# Patient Record
Sex: Female | Born: 1955 | Race: Black or African American | Hispanic: No | Marital: Single | Smoking: Never smoker
Health system: Southern US, Community
[De-identification: ages and names within clinical notes are randomized; demographics above are authoritative.]

## PROBLEM LIST (undated history)

## (undated) DIAGNOSIS — G459 Transient cerebral ischemic attack, unspecified: Secondary | ICD-10-CM

## (undated) DIAGNOSIS — I10 Essential (primary) hypertension: Secondary | ICD-10-CM

## (undated) DIAGNOSIS — E119 Type 2 diabetes mellitus without complications: Secondary | ICD-10-CM

## (undated) DIAGNOSIS — Z8619 Personal history of other infectious and parasitic diseases: Secondary | ICD-10-CM

## (undated) DIAGNOSIS — R7303 Prediabetes: Secondary | ICD-10-CM

## (undated) HISTORY — PX: EYE SURGERY: SHX253

## (undated) HISTORY — DX: Essential (primary) hypertension: I10

## (undated) HISTORY — DX: Personal history of other infectious and parasitic diseases: Z86.19

## (undated) HISTORY — DX: Prediabetes: R73.03

## (undated) HISTORY — DX: Type 2 diabetes mellitus without complications: E11.9

---

## 2003-06-08 HISTORY — PX: OTHER SURGICAL HISTORY: SHX169

## 2010-06-07 LAB — HM COLONOSCOPY

## 2010-06-07 LAB — HM PAP SMEAR

## 2011-06-08 LAB — HM MAMMOGRAPHY: HM MAMMO: NORMAL (ref 0–4)

## 2016-09-28 ENCOUNTER — Emergency Department (HOSPITAL_BASED_OUTPATIENT_CLINIC_OR_DEPARTMENT_OTHER): Payer: 59

## 2016-09-28 ENCOUNTER — Emergency Department (HOSPITAL_BASED_OUTPATIENT_CLINIC_OR_DEPARTMENT_OTHER)
Admission: EM | Admit: 2016-09-28 | Discharge: 2016-09-28 | Disposition: A | Discharge status: Home / Self Care | Payer: 59 | Attending: Emergency Medicine | Admitting: Emergency Medicine

## 2016-09-28 ENCOUNTER — Encounter (HOSPITAL_BASED_OUTPATIENT_CLINIC_OR_DEPARTMENT_OTHER): Payer: Self-pay | Admitting: Emergency Medicine

## 2016-09-28 DIAGNOSIS — R030 Elevated blood-pressure reading, without diagnosis of hypertension: Secondary | ICD-10-CM

## 2016-09-28 DIAGNOSIS — R739 Hyperglycemia, unspecified: Secondary | ICD-10-CM | POA: Diagnosis not present

## 2016-09-28 DIAGNOSIS — I10 Essential (primary) hypertension: Secondary | ICD-10-CM | POA: Diagnosis not present

## 2016-09-28 DIAGNOSIS — E860 Dehydration: Secondary | ICD-10-CM

## 2016-09-28 DIAGNOSIS — R4182 Altered mental status, unspecified: Secondary | ICD-10-CM | POA: Diagnosis present

## 2016-09-28 DIAGNOSIS — R410 Disorientation, unspecified: Secondary | ICD-10-CM

## 2016-09-28 LAB — CBC
HEMATOCRIT: 32.1 % — AB (ref 36.0–46.0)
Hemoglobin: 10.1 g/dL — ABNORMAL LOW (ref 12.0–15.0)
MCH: 22.9 pg — AB (ref 26.0–34.0)
MCHC: 31.5 g/dL (ref 30.0–36.0)
MCV: 72.6 fL — AB (ref 78.0–100.0)
PLATELETS: 297 10*3/uL (ref 150–400)
RBC: 4.42 MIL/uL (ref 3.87–5.11)
RDW: 15.9 % — ABNORMAL HIGH (ref 11.5–15.5)
WBC: 7 10*3/uL (ref 4.0–10.5)

## 2016-09-28 LAB — COMPREHENSIVE METABOLIC PANEL
ALBUMIN: 3.7 g/dL (ref 3.5–5.0)
ALT: 15 U/L (ref 14–54)
AST: 19 U/L (ref 15–41)
Alkaline Phosphatase: 67 U/L (ref 38–126)
Anion gap: 10 (ref 5–15)
BUN: 12 mg/dL (ref 6–20)
CHLORIDE: 100 mmol/L — AB (ref 101–111)
CO2: 25 mmol/L (ref 22–32)
Calcium: 8.9 mg/dL (ref 8.9–10.3)
Creatinine, Ser: 0.66 mg/dL (ref 0.44–1.00)
GFR calc Af Amer: >60 mL/min (ref >60)
GFR calc non Af Amer: >60 mL/min (ref >60)
Glucose, Bld: 304 mg/dL — ABNORMAL HIGH (ref 65–99)
POTASSIUM: 3.9 mmol/L (ref 3.5–5.1)
SODIUM: 135 mmol/L (ref 135–145)
Total Bilirubin: 0.4 mg/dL (ref 0.3–1.2)
Total Protein: 7.7 g/dL (ref 6.5–8.1)

## 2016-09-28 LAB — TROPONIN I: Troponin I: <0.03 ng/mL (ref <0.03)

## 2016-09-28 LAB — URINALYSIS, ROUTINE W REFLEX MICROSCOPIC
Bilirubin Urine: NEGATIVE
Glucose, UA: >=500 mg/dL — AB
Hgb urine dipstick: NEGATIVE
Ketones, ur: 15 mg/dL — AB
NITRITE: NEGATIVE
PROTEIN: NEGATIVE mg/dL
Specific Gravity, Urine: 1.031 — ABNORMAL HIGH (ref 1.005–1.030)
pH: 5 (ref 5.0–8.0)

## 2016-09-28 LAB — URINALYSIS, MICROSCOPIC (REFLEX): RBC / HPF: NONE SEEN RBC/hpf (ref 0–5)

## 2016-09-28 LAB — CBG MONITORING, ED: Glucose-Capillary: 307 mg/dL — ABNORMAL HIGH (ref 65–99)

## 2016-09-28 MED ORDER — SODIUM CHLORIDE 0.9 % IV BOLUS (SEPSIS)
500.0000 mL | Freq: Once | INTRAVENOUS | Status: AC
  Administered 2016-09-28: 500 mL via INTRAVENOUS

## 2016-09-28 NOTE — ED Notes (Signed)
Urine cup given. She just urinated before coming to the room.

## 2016-09-28 NOTE — ED Provider Notes (Signed)
[facility] Provider Note   CSN: [id number] Arrival date & time: 09/28/16  1456     History   Chief Complaint Chief Complaint  Patient presents with  . Altered Mental Status    HPI Alexis Bray is a 61 y.o. female.  The history is provided by the patient and a friend. No language interpreter was used.  Altered Mental Status      Alexis Bray is a 61 y.o. female who presents to the Emergency Department complaining of confusion.  She is accompanied by her friend for evaluation of increasing confusion over the last 3-4 weeks. She moved to the [place] area from [place] 4 years ago. She is currently living alone but she does have family nearby. She does not have an established family doctor and has not been evaluated for several years by a physician. She states that she is feeling that she is a little stressed still depressed because all she does is work and they get home and watch TV. Her friend notes that she seems to be more confused and is repeating herself more recently. She denies any headaches, numbness, weakness, vision changes, chest pain, shortness of breath, nausea, vomiting. She denies any SI or HI. She just states that she is unhappy in her current situation.  History reviewed. No pertinent past medical history.  There are no active problems to display for this patient.   Past Surgical History:  Procedure Laterality Date  . CESAREAN SECTION    . EYE SURGERY      OB History    No data available       Home Medications    Prior to Admission medications   Not on File    Family History No family history on file.  Social History Social History  Substance Use Topics  . Smoking status: Never Smoker  . Smokeless tobacco: Never Used  . Alcohol use No     Allergies   Aspirin   Review of Systems Review of Systems  All other systems reviewed and are negative.    Physical Exam Updated Vital Signs BP (!) 186/91 (BP Location: Left Arm)    Pulse 80   Temp 98.8 F (37.1 C) (Oral)   Resp 18   Ht  (1.626 m)   Wt 143 lb (64.9 kg)   SpO2 98%   BMI 24.55 kg/m   Physical Exam  Constitutional: She is oriented to person, place, and time. She appears well-developed and well-nourished.  HENT:  Head: Normocephalic and atraumatic.  Eyes: EOM are normal. Pupils are equal, round, and reactive to light.  Cardiovascular: Normal rate and regular rhythm.   No murmur heard. Pulmonary/Chest: Effort normal and breath sounds normal. No respiratory distress.  Abdominal: Soft. There is no tenderness. There is no rebound and no guarding.  Musculoskeletal: She exhibits no edema or tenderness.  Neurological: She is alert and oriented to person, place, and time. No cranial nerve deficit. Coordination normal.  5 out of 5 strength in all 4 extremities with sensation to light touch intact in all 4 extremities.  Skin: Skin is warm and dry.  Psychiatric:  Mildly anxious and slightly tearful at times denies any SI, HI, or hallucinations.  Nursing note and vitals reviewed.    ED Treatments / Results  Labs (all labs ordered are listed, but only abnormal results are displayed) Labs Reviewed  URINALYSIS, ROUTINE W REFLEX MICROSCOPIC - Abnormal; Notable for the following:       Result Value  APPearance CLOUDY (*)    Specific Gravity, Urine 1.031 (*)    Glucose, UA >=500 (*)    Ketones, ur 15 (*)    Leukocytes, UA MODERATE (*)    All other components within normal limits  COMPREHENSIVE METABOLIC PANEL - Abnormal; Notable for the following:    Chloride 100 (*)    Glucose, Bld 304 (*)    All other components within normal limits  CBC - Abnormal; Notable for the following:    Hemoglobin 10.1 (*)    HCT 32.1 (*)    MCV 72.6 (*)    MCH 22.9 (*)    RDW 15.9 (*)    All other components within normal limits  URINALYSIS, MICROSCOPIC (REFLEX) - Abnormal; Notable for the following:    Bacteria, UA FEW (*)    Squamous Epithelial / LPF 0-5 (*)      All other components within normal limits  CBG MONITORING, ED - Abnormal; Notable for the following:    Glucose-Capillary 307 (*)    All other components within normal limits  URINE CULTURE  TROPONIN I    EKG  EKG Interpretation  Date/Time:  Tuesday September 28 2016 15:22:42 EDT Ventricular Rate:  84 PR Interval:    QRS Duration: 93 QT Interval:  361 QTC Calculation: 427 R Axis:   113 Text Interpretation:  Right and left arm electrode reversal, interpretation assumes no reversal Sinus rhythm Right axis deviation Consider left ventricular hypertrophy Nonspecific T abnormalities, diffuse leads no prior available for comparision Confirmed by Lincoln Brigham [phone]) on 09/28/2016 3:28:47 PM       Radiology Ct Head Wo Contrast  Result Date: 09/28/2016 CLINICAL DATA:  Confusion over the last 3-4 weeks, repeating questions EXAM: CT HEAD WITHOUT CONTRAST TECHNIQUE: Contiguous axial images were obtained from the base of the skull through the vertex without intravenous contrast. COMPARISON:  None. FINDINGS: Brain: The ventricular system is normal in size and configuration with minimal prominence of cortical sulci. The septum is midline in position. The fourth ventricle and basilar cisterns are unremarkable. A small low-attenuation is noted in the posterior limb of the right internal capsule which may represent a small lacunar infarct. No hemorrhage, mass lesion, or acute infarction is seen. Vascular: No vascular abnormality is noted on this unenhanced study. Skull: On bone window images, no acute calvarial abnormality is seen. Sinuses/Orbits: There is complete opacification of the right maxillary sinus which may be chronic with some thickening of the sinus walls. Those also is opacification of few right ethmoid air cells with narrowed right nasal airway present. Some fluid layers within the posterior sphenoid sinus. Other: 1. No acute intracranial abnormality. Question of small right posterior limb internal  capsule lacunar infarct. 2. Complete opacification of the right maxillary sinus possibly chronic in nature with some opacification of right ethmoid air cells and narrowed right nasal airway. Electronically Signed   By: Dwyane Dee M.D.   On: 09/28/2016 16:08    Procedures Procedures (including critical care time)  Medications Ordered in ED Medications  sodium chloride 0.9 % bolus 500 mL (500 mLs Intravenous New Bag/Given 09/28/16 1653)     Initial Impression / Assessment and Plan / ED Course  I have reviewed the triage vital signs and the nursing notes.  Pertinent labs & imaging results that were available during my care of the patient were reviewed by me and considered in my medical decision making (see chart for details).     Patient here for evaluation of confusion over  the last 3-4 weeks at the encouragement of her friend. Patient states that she is anxious and depressed. Her friend feels like she is more repetitive and more confused at times. She has not been seen by a family doctor in over 4 years. EKG does show evidence of LVH and her blood pressure is elevated in the emergency department. She is asymptomatic at this time, question ongoing untreated hypertension. BMP demonstrates hyperglycemia around 300 and urine is concentrated. Patient did eat a large lunch at [employer] prior to ED presentation but discuss concerns for possible diabetes. CT scan shows questionable small stroke. Her symptoms have been ongoing for the last several weeks and she has a nonfocal neurologic examination in the emergency department. Discussed with patient's findings of all the studies and importance of establishing a primary care provider for medical workup and management. Discussed outpatient follow-up as well as return precautions.  Final Clinical Impressions(s) / ED Diagnoses   Final diagnoses:  Dehydration  Confusion  Hyperglycemia  Elevated blood pressure reading in office without diagnosis of  hypertension    New Prescriptions New Prescriptions   No medications on file     Tilden Fossa, MD 09/28/16 1658

## 2016-09-28 NOTE — Discharge Instructions (Signed)
You had multiple studies performed in the emergency department today. Both your blood pressure and your blood sugar were elevated.  Your CT scan of your brain shows evidence of a possible small stroke in the past. Please be sure to get set up with family doctor for further testing. Get rechecked immediately if you develop any new or worrisome symptoms.

## 2016-09-28 NOTE — ED Triage Notes (Addendum)
Confusion, noticed by her friend yesterday. Prior to yesterday friend had not seen her in 3 weeks.  Her friend states she kept asking the same questions repeatedly yesterday and today. Pt reports she feels anxious, has had difficulty making decisions for a week. Pt denies pain, recent illness.

## 2016-09-29 ENCOUNTER — Telehealth: Payer: Self-pay

## 2016-09-29 NOTE — Telephone Encounter (Signed)
Pt's daughter Harvel Quale called in to scheduled a visit with provider. She says that pt was seen in the ed and admitted for stroke symptoms. Pt isn't currently a pt. Scheduled OV with provider [place]. Informed daughter to be working on getting medical records before visit as it will be helpful to provider. Daughter states that she will.

## 2016-09-30 ENCOUNTER — Ambulatory Visit (INDEPENDENT_AMBULATORY_CARE_PROVIDER_SITE_OTHER): Payer: 59 | Admitting: Family Medicine

## 2016-09-30 ENCOUNTER — Encounter: Payer: Self-pay | Admitting: Family Medicine

## 2016-09-30 VITALS — BP 190/66 | HR 79 | Temp 98.7°F | Ht 63.0 in | Wt 142.4 lb

## 2016-09-30 DIAGNOSIS — R413 Other amnesia: Secondary | ICD-10-CM | POA: Diagnosis not present

## 2016-09-30 DIAGNOSIS — R739 Hyperglycemia, unspecified: Secondary | ICD-10-CM

## 2016-09-30 DIAGNOSIS — I10 Essential (primary) hypertension: Secondary | ICD-10-CM | POA: Diagnosis not present

## 2016-09-30 LAB — LIPID PANEL
CHOLESTEROL: 165 mg/dL (ref 0–200)
HDL: 50 mg/dL (ref >39.00)
LDL CALC: 101 mg/dL — AB (ref 0–99)
NonHDL: 115.39
TRIGLYCERIDES: 74 mg/dL (ref 0.0–149.0)
Total CHOL/HDL Ratio: 3
VLDL: 14.8 mg/dL (ref 0.0–40.0)

## 2016-09-30 LAB — COMPREHENSIVE METABOLIC PANEL
ALBUMIN: 4 g/dL (ref 3.5–5.2)
ALT: 11 U/L (ref 0–35)
AST: 13 U/L (ref 0–37)
Alkaline Phosphatase: 58 U/L (ref 39–117)
BUN: 14 mg/dL (ref 6–23)
CALCIUM: 9.1 mg/dL (ref 8.4–10.5)
CHLORIDE: 100 meq/L (ref 96–112)
CO2: 27 mEq/L (ref 19–32)
CREATININE: 0.65 mg/dL (ref 0.40–1.20)
GFR: 118.87 mL/min (ref >60.00)
Glucose, Bld: 292 mg/dL — ABNORMAL HIGH (ref 70–99)
POTASSIUM: 3.9 meq/L (ref 3.5–5.1)
Sodium: 135 mEq/L (ref 135–145)
Total Bilirubin: 0.3 mg/dL (ref 0.2–1.2)
Total Protein: 7.6 g/dL (ref 6.0–8.3)

## 2016-09-30 LAB — URINE CULTURE

## 2016-09-30 LAB — TSH: TSH: 1.9 u[IU]/mL (ref 0.35–4.50)

## 2016-09-30 LAB — HEMOGLOBIN A1C: HEMOGLOBIN A1C: 11.9 % — AB (ref 4.6–6.5)

## 2016-09-30 MED ORDER — AMLODIPINE BESYLATE 5 MG PO TABS: 5.0000 mg | ORAL_TABLET | Freq: Every day | ORAL | 5 refills | Status: DC

## 2016-09-30 NOTE — Progress Notes (Signed)
Chief Complaint  Patient presents with  . Establish Care    hosp follow-up-dehydration       New Patient Visit SUBJECTIVE: HPI: Alexis Bray is an 61 y.o.female who is being seen for establishing care.  The patient was previously seen in [facility]. Here with daughter. She lives by herself.   The patient has not established with a primary care provider since moving down in 2013. She was seen in the emergency department forceps with 4/18 for altered mental status. She was found to have hyperglycemia, microcytic anemia, and possible old lacunar infarct on CT head. Per the daughter, she's been concerned about her mother over the past 4 days. She has been calling and asking repetitive questions and has been very forgetful. Per a close friend, this is been going on for several weeks now. Today, the patient is oriented to month, date, person, however is unsure of the year (states it is 2016), does not know the president, and does not know where we are. She denies any memory issues. She is adopted so her family history is unknown. She does have a long history of having been told she has high blood pressure, however has not been taking any medication. She does not check her blood pressure routinely at home. The patient works at [employer] and appears able to do her duties. No reports from coworkers or supervisors regarding this. Denies fevers, urinary complaints, recent illness, or recent travel. She does not use illicit drugs. No alcohol usage.      Allergies  Allergen Reactions  . Aspirin Nausea And Vomiting    Past Medical History:  Diagnosis Date  . History of chicken pox   . Hypertension   . Prediabetes    Past Surgical History:  Procedure Laterality Date  . Ankles surgery Right 2005  . CESAREAN SECTION    . EYE SURGERY     Social History   Social History  . Marital status: Single   Social History Main Topics  . Smoking status: Never Smoker  . Smokeless tobacco: Never Used  . Alcohol  use No  . Drug use: No   Family History  Problem Relation Age of Onset  . Adopted: Yes   Takes no medications routinely.    No LMP recorded. Patient is postmenopausal.  ROS : Denies chest pain  Psych: Denies depression or anxiety   OBJECTIVE: BP (!) 190/66 (BP Location: Right Arm, Patient Position: Sitting, Cuff Size: Normal)   Pulse 79   Temp 98.7 F (37.1 C) (Oral)   Ht  (1.6 m)   Wt 142 lb 6.4 oz (64.6 kg)   SpO2 99%   BMI 25.23 kg/m   Constitutional: -  VS reviewed -  Well developed, well nourished, appears stated age -  No apparent distress  Psychiatric: -  Oriented to person, place, and time -  Memory not intact -  Affect and mood normal- did become tearful during exam -  Fluent conversation, good eye contact -  Judgment and insight likely limited  Eye: -  Conjunctivae clear, no discharge -  Pupils symmetric, round, reactive to light  ENMT: -  Oral mucosa without lesions, tongue and uvula midline    Tonsils not enlarged, no erythema, no exudate, trachea midline    Pharynx moist, no lesions, no erythema  Neck: -  No gross swelling, no palpable masses -  Thyroid midline, not enlarged, mobile, no palpable masses  Cardiovascular: -  RRR, no murmurs -  No  LE edema  Respiratory: -  Normal respiratory effort, no accessory muscle use, no retraction -  Breath sounds equal, no wheezes, no ronchi, no crackles  Neurological:  -  CN II - XII grossly intact -  Oriented to person, month and date; unsure of year, president and location -  2/4 patellar reflex b/l, 1/4 calcaneal and biceps reflex b/l, no clonus  Musculoskeletal: -  No clubbing, no cyanosis -  Gait normal  Skin: -  No significant lesion on inspection -  Warm and dry to palpation   ASSESSMENT/PLAN: Essential hypertension - Plan: amLODipine (NORVASC) 5 MG tablet, Lipid panel  Memory loss - Plan: Ambulatory referral to Neurology, Comprehensive metabolic panel, TSH, MR Brain Wo Contrast  Hyperglycemia -  Plan: Hemoglobin A1c  Start Norvasc based on elevated BP, likely has been untreated for years. Refer Neurology for second opinion and see if they believe this falls into dementia category rather than acute delirium, though I do not have a cause for the latter. Patient should return in early June to recheck BP.  The patient and her daughter voiced understanding and agreement to the plan.   Jilda Roche [place], DO 09/30/16  1:31 PM

## 2016-09-30 NOTE — Progress Notes (Signed)
Pre visit review using our clinic review tool, if applicable. No additional management support is needed unless otherwise documented below in the visit note. 

## 2016-09-30 NOTE — Patient Instructions (Addendum)
Give [place] 2-3 business days to get the results of your labs back.   If you do not hear anything about your Neurology referral or MRI in the next 1-2 weeks, call our office and ask for an update.  OK to continue living alone and working.

## 2016-10-01 ENCOUNTER — Encounter: Payer: Self-pay | Admitting: Neurology

## 2016-10-02 ENCOUNTER — Ambulatory Visit (HOSPITAL_BASED_OUTPATIENT_CLINIC_OR_DEPARTMENT_OTHER)
Admission: RE | Admit: 2016-10-02 | Discharge: 2016-10-02 | Disposition: A | Discharge status: Home / Self Care | Payer: 59 | Source: Ambulatory Visit | Attending: Family Medicine | Admitting: Family Medicine

## 2016-10-02 ENCOUNTER — Other Ambulatory Visit: Payer: Self-pay | Admitting: Family Medicine

## 2016-10-02 ENCOUNTER — Telehealth: Payer: Self-pay | Admitting: Family Medicine

## 2016-10-02 DIAGNOSIS — J329 Chronic sinusitis, unspecified: Secondary | ICD-10-CM | POA: Insufficient documentation

## 2016-10-02 DIAGNOSIS — M2548 Effusion, other site: Secondary | ICD-10-CM | POA: Diagnosis not present

## 2016-10-02 DIAGNOSIS — Z8673 Personal history of transient ischemic attack (TIA), and cerebral infarction without residual deficits: Secondary | ICD-10-CM | POA: Diagnosis not present

## 2016-10-02 DIAGNOSIS — R413 Other amnesia: Secondary | ICD-10-CM

## 2016-10-02 MED ORDER — CEFDINIR 300 MG PO CAPS: 300.0000 mg | ORAL_CAPSULE | Freq: Two times a day (BID) | ORAL | 0 refills | Status: DC

## 2016-10-02 MED ORDER — CLOPIDOGREL BISULFATE 75 MG PO TABS: 75.0000 mg | ORAL_TABLET | Freq: Every day | ORAL | 3 refills | Status: DC

## 2016-10-02 NOTE — Telephone Encounter (Signed)
Received a call from Dr Shella Maxim of [facility] radiology, MRI reviewed patient with 2 subacute strokes approximately age 61 week and 1 month and a right sided sinusitis.  Spoke with patient and her daughter. The patient is doing well and has been staying with her daughter. They offer no concerns of any new episodes. Her daughter notes some short term memory concerns but no other neurologic complaints. Patient has been taking her Amlodipine 5 mg po daily without difficulty but she has not checked her blood pressure. After long discussion they agree to go to pharmacy and pick up blood thinner, sent in Plavix 75 mg tabs po daily. Also sent in [facility] for sinusitis. They agree to pick up an [employer] blood pressure cuff so they can begin to monitor her blood pressure. We will have her come in to the office early in the week for a blood pressure check.  If her symptoms change they are to present to the emergency room.

## 2016-10-03 NOTE — Telephone Encounter (Signed)
Called to check on patient and daughter. Daughter reports mother seems to be doing better today. Less confused and engaging in activities she historically likes such as working in the garden. No new complaints. They were able to get the Plavix started last night but did not pick up the Cefdinir. They will go back out to pick that up when they go out to get the blood pressure cuff. They agree to be seen in the office this week, will seek care if any worsening symptoms develop.

## 2016-10-04 ENCOUNTER — Telehealth: Payer: Self-pay | Admitting: Family Medicine

## 2016-10-04 MED ORDER — CLOPIDOGREL BISULFATE 75 MG PO TABS: 75.0000 mg | ORAL_TABLET | Freq: Every day | ORAL | 3 refills | Status: AC

## 2016-10-04 NOTE — Telephone Encounter (Signed)
Pt's daughter called in to follow up on Rx for the pt's blood thinner. She says that the pharmacy never received Rx. She says that pt currently have 1 pill left because pharmacy gave them 3 pills to cover them over the weekend. Please assist further.    Pharmacy: [id number] - [place], [place] - [address]   CB: [phone] - Daughter

## 2016-10-04 NOTE — Addendum Note (Signed)
Addended by: Radene Gunning on: 10/04/2016 05:01 PM   Modules accepted: Orders

## 2016-10-04 NOTE — Telephone Encounter (Signed)
Called and spoke with the pt's daughter and informed her that the prescription has been resent.  She stated that she will be going by the pharmacy.//AB/CMA

## 2016-10-04 NOTE — Telephone Encounter (Signed)
Resent. Let us know if there are issues. TY.

## 2016-10-06 ENCOUNTER — Encounter: Payer: Self-pay | Admitting: Family Medicine

## 2016-10-06 ENCOUNTER — Ambulatory Visit (INDEPENDENT_AMBULATORY_CARE_PROVIDER_SITE_OTHER): Payer: 59 | Admitting: Family Medicine

## 2016-10-06 VITALS — BP 190/90 | HR 88 | Temp 98.5°F | Ht 63.0 in | Wt 141.4 lb

## 2016-10-06 DIAGNOSIS — F0150 Vascular dementia without behavioral disturbance: Secondary | ICD-10-CM

## 2016-10-06 DIAGNOSIS — I679 Cerebrovascular disease, unspecified: Secondary | ICD-10-CM

## 2016-10-06 DIAGNOSIS — E1165 Type 2 diabetes mellitus with hyperglycemia: Secondary | ICD-10-CM | POA: Diagnosis not present

## 2016-10-06 DIAGNOSIS — I10 Essential (primary) hypertension: Secondary | ICD-10-CM | POA: Diagnosis not present

## 2016-10-06 DIAGNOSIS — Z23 Encounter for immunization: Secondary | ICD-10-CM | POA: Diagnosis not present

## 2016-10-06 DIAGNOSIS — E1159 Type 2 diabetes mellitus with other circulatory complications: Secondary | ICD-10-CM

## 2016-10-06 LAB — BASIC METABOLIC PANEL
BUN: 11 mg/dL (ref 6–23)
CHLORIDE: 100 meq/L (ref 96–112)
CO2: 24 meq/L (ref 19–32)
Calcium: 9.4 mg/dL (ref 8.4–10.5)
Creatinine, Ser: 0.61 mg/dL (ref 0.40–1.20)
GFR: 127.9 mL/min (ref >60.00)
GLUCOSE: 334 mg/dL — AB (ref 70–99)
POTASSIUM: 4.1 meq/L (ref 3.5–5.1)
SODIUM: 132 meq/L — AB (ref 135–145)

## 2016-10-06 LAB — MICROALBUMIN / CREATININE URINE RATIO
Creatinine,U: 128.5 mg/dL
MICROALB/CREAT RATIO: 1.7 mg/g (ref 0.0–30.0)
Microalb, Ur: 2.2 mg/dL — ABNORMAL HIGH (ref 0.0–1.9)

## 2016-10-06 MED ORDER — METFORMIN HCL 500 MG PO TABS: ORAL_TABLET | ORAL | 1 refills | Status: DC

## 2016-10-06 MED ORDER — ATORVASTATIN CALCIUM 80 MG PO TABS: 80.0000 mg | ORAL_TABLET | Freq: Every day | ORAL | 3 refills | Status: DC

## 2016-10-06 MED ORDER — AMLODIPINE BESYLATE-VALSARTAN 5-320 MG PO TABS: 1.0000 | ORAL_TABLET | Freq: Every day | ORAL | 2 refills | Status: AC

## 2016-10-06 MED ORDER — ONETOUCH DELICA LANCETS FINE MISC: 12 refills | Status: AC

## 2016-10-06 MED ORDER — GLUCOSE BLOOD VI STRP: ORAL_STRIP | 12 refills | Status: AC

## 2016-10-06 NOTE — Progress Notes (Signed)
Chief Complaint  Patient presents with  . Follow-up    discuss recent lab results    Subjective: Patient is a 61 y.o. female here for MRI results.  Patient had an MRI done last week that showed 2 areas of the brain that suffered infarctions. She has no history of any stroke like symptoms. She does have a history of uncontrolled high blood pressure. She was also found to be diabetic with an A1c of 11.9. She has an appointment with neurology in one month.  She does have a history of nausea and vomiting when taking aspirin. She does not remember if she took it with food or not. She is willing to try again. She denies any swelling, rash, or difficulty breathing when taking it.  The physician on call did inform her of some of her results and call then [facility] for sinus disease on the right. She feels fine and does note she has history of allergies. No fevers.  Family History  Problem Relation Age of Onset  . Adopted: Yes   Past Medical History:  Diagnosis Date  . History of chicken pox   . Hypertension   . Prediabetes    Allergies  Allergen Reactions  . Aspirin Nausea And Vomiting    Current Outpatient Prescriptions:  .  clopidogrel (PLAVIX) 75 MG tablet, Take 1 tablet (75 mg total) by mouth daily., Disp: 30 tablet, Rfl: 3 .  amLODipine-valsartan (EXFORGE) 5-320 MG tablet, Take 1 tablet by mouth daily., Disp: 30 tablet, Rfl: 2 .  atorvastatin (LIPITOR) 80 MG tablet, Take 1 tablet (80 mg total) by mouth daily., Disp: 90 tablet, Rfl: 3 .  metFORMIN (GLUCOPHAGE) 500 MG tablet, Week 1: 1 tab daily Week 2: 1 tab twice daily Week 3: 2 tabs in AM, 1 in evening Week 4: 2 tabs twice daily, Disp: 120 tablet, Rfl: 1  Objective: BP (!) 190/90 (BP Location: Left Arm, Patient Position: Sitting, Cuff Size: Normal)   Pulse 88   Temp 98.5 F (36.9 C) (Oral)   Ht  (1.6 m)   Wt 141 lb 6.4 oz (64.1 kg)   SpO2 99%   BMI 25.05 kg/m  General: Awake, appears stated age Lungs: No accessory  muscle use Psych: Normal affect and mood  Assessment and Plan: Vascular dementia without behavioral disturbance  Type 2 diabetes mellitus with hyperglycemia, without long-term current use of insulin (HCC) - Plan: atorvastatin (LIPITOR) 80 MG tablet, metFORMIN (GLUCOPHAGE) 500 MG tablet, Ambulatory referral to Ophthalmology, Microalbumin / creatinine urine ratio  Hypertension associated with diabetes (HCC) - Plan: Basic metabolic panel, amLODipine-valsartan (EXFORGE) 5-320 MG tablet, Basic metabolic panel  Cerebral vascular disease - Plan: atorvastatin (LIPITOR) 80 MG tablet  Memory disturbance is likely secondary to subacute stroke. Patient has neurology appointment in early June. From our end, we will mitigate risk of future strokes. She will try to take a baby aspirin daily, will let us know so we can take Plavix off of her list. Counseled on diet and exercise. DASH diet given. Start metformin, refer to ophthalmology, get microalbumin creatinine ratio. We'll get diabetic foot exam at next visit. Check sugars 2-3 times weekly. Recheck A1c in 3 mo. Start Lipitor. She will be checking her blood pressures at home routinely. We'll add valsartan in combination pill form to her Norvasc. Recheck BMP in 1 week. Stop Omnicef. MRI not good for detecting sinus disease.  I would like to see how she is doing in one month. The patient and her daughtervoiced understanding  and agreement to the plan.  Greater than 25 minutes were spent face to face with the patient with greater than 50% of this time spent counseling on DM, treatment, monitoring sugars, prognosis, hypertension, treatment options, monitoring pressures, and mitigating future risk of stroke, heart attack and death.    Jilda Roche [place], DO 10/06/16  10:06 AM

## 2016-10-06 NOTE — Patient Instructions (Addendum)
Around 3 times per week, check your blood pressure 4 times per day. Twice in the morning and twice in the evening. The readings should be at least one minute apart. Write down these values and bring them to your next nurse visit/appointment.  When you check your BP, make sure you have been doing something calm/relaxing 5 minutes prior to checking. Both feet should be flat on the floor and you should be sitting. Use your left arm and make sure it is in a relaxed position (on a table), and that the cuff is at the approximate level/height of your heart.  Stop Norvasc for now, you are starting a new combination medicine.  If you do not hear anything about your referral in the next 1-2 weeks, call our office and ask for an update.  Try taking a baby aspirin (81 mg) daily instead of Plavix. Let us know if this works out and I can take the Plavix off of your medicine list.  Aim to do some physical exertion for 150 minutes per week. This is typically divided into 5 days per week, 30 minutes per day. The activity should be enough to get your heart rate up. Anything is better than nothing if you have time constraints. Try to lift some weights. Yoga is a good option, check out Youtube for instructional videos.  Check your sugars in the morning before you eat. Check them 2-3 times per week. Write them down.  If any medicine we call in for you is too expensive, don't fill it and let us know.  DASH Eating Plan DASH stands for "Dietary Approaches to Stop Hypertension." The DASH eating plan is a healthy eating plan that has been shown to reduce high blood pressure (hypertension). It may also reduce your risk for type 2 diabetes, heart disease, and stroke. The DASH eating plan may also help with weight loss. What are tips for following this plan? General guidelines   Avoid eating more than 2,300 mg (milligrams) of salt (sodium) a day. If you have hypertension, you may need to reduce your sodium intake to 1,500  mg a day.  Limit alcohol intake to no more than 1 drink a day for nonpregnant women and 2 drinks a day for men. One drink equals 12 oz of beer, 5 oz of wine, or 1 oz of hard liquor.  Work with your health care provider to maintain a healthy body weight or to lose weight. Ask what an ideal weight is for you.  Get at least 30 minutes of exercise that causes your heart to beat faster (aerobic exercise) most days of the week. Activities may include walking, swimming, or biking.  Work with your health care provider or diet and nutrition specialist (dietitian) to adjust your eating plan to your individual calorie needs. Reading food labels   Check food labels for the amount of sodium per serving. Choose foods with less than 5 percent of the Daily Value of sodium. Generally, foods with less than 300 mg of sodium per serving fit into this eating plan.  To find whole grains, look for the word "whole" as the first word in the ingredient list. Shopping   Buy products labeled as "low-sodium" or "no salt added."  Buy fresh foods. Avoid canned foods and premade or frozen meals. Cooking   Avoid adding salt when cooking. Use salt-free seasonings or herbs instead of table salt or sea salt. Check with your health care provider or pharmacist before using salt substitutes.  Do  not fry foods. Cook foods using healthy methods such as baking, boiling, grilling, and broiling instead.  Cook with heart-healthy oils, such as olive, canola, soybean, or sunflower oil. Meal planning    Eat a balanced diet that includes:  5 or more servings of fruits and vegetables each day. At each meal, try to fill half of your plate with fruits and vegetables.  Up to 6-8 servings of whole grains each day.  Less than 6 oz of lean meat, poultry, or fish each day. A 3-oz serving of meat is about the same size as a deck of cards. One egg equals 1 oz.  2 servings of low-fat dairy each day.  A serving of nuts, seeds, or beans  5 times each week.  Heart-healthy fats. Healthy fats called Omega-3 fatty acids are found in foods such as flaxseeds and coldwater fish, like sardines, salmon, and mackerel.  Limit how much you eat of the following:  Canned or prepackaged foods.  Food that is high in trans fat, such as fried foods.  Food that is high in saturated fat, such as fatty meat.  Sweets, desserts, sugary drinks, and other foods with added sugar.  Full-fat dairy products.  Do not salt foods before eating.  Try to eat at least 2 vegetarian meals each week.  Eat more home-cooked food and less restaurant, buffet, and fast food.  When eating at a restaurant, ask that your food be prepared with less salt or no salt, if possible. What foods are recommended? The items listed may not be a complete list. Talk with your dietitian about what dietary choices are best for you. Grains  Whole-grain or whole-wheat bread. Whole-grain or whole-wheat pasta. Brown rice. Orpah Cobb. Bulgur. Whole-grain and low-sodium cereals. Pita bread. Low-fat, low-sodium crackers. Whole-wheat flour tortillas. Vegetables  Fresh or frozen vegetables (raw, steamed, roasted, or grilled). Low-sodium or reduced-sodium tomato and vegetable juice. Low-sodium or reduced-sodium tomato sauce and tomato paste. Low-sodium or reduced-sodium canned vegetables. Fruits  All fresh, dried, or frozen fruit. Canned fruit in natural juice (without added sugar). Meat and other protein foods  Skinless chicken or [place]. Ground chicken or [place]. Pork with fat trimmed off. Fish and seafood. Egg whites. Dried beans, peas, or lentils. Unsalted nuts, nut butters, and seeds. Unsalted canned beans. Lean cuts of beef with fat trimmed off. Low-sodium, lean deli meat. Dairy  Low-fat (1%) or fat-free (skim) milk. Fat-free, low-fat, or reduced-fat cheeses. Nonfat, low-sodium ricotta or cottage cheese. Low-fat or nonfat yogurt. Low-fat, low-sodium cheese. Fats and oils   Soft margarine without trans fats. Vegetable oil. Low-fat, reduced-fat, or light mayonnaise and salad dressings (reduced-sodium). Canola, safflower, olive, soybean, and sunflower oils. Avocado. Seasoning and other foods  Herbs. Spices. Seasoning mixes without salt. Unsalted popcorn and pretzels. Fat-free sweets. What foods are not recommended? The items listed may not be a complete list. Talk with your dietitian about what dietary choices are best for you. Grains  Baked goods made with fat, such as croissants, muffins, or some breads. Dry pasta or rice meal packs. Vegetables  Creamed or fried vegetables. Vegetables in a cheese sauce. Regular canned vegetables (not low-sodium or reduced-sodium). Regular canned tomato sauce and paste (not low-sodium or reduced-sodium). Regular tomato and vegetable juice (not low-sodium or reduced-sodium). Rosita Fire. Olives. Fruits  Canned fruit in a light or heavy syrup. Fried fruit. Fruit in cream or butter sauce. Meat and other protein foods  Fatty cuts of meat. Ribs. Fried meat. Tomasa Blase. Sausage. Bologna and other processed lunch  meats. Salami. Fatback. Hotdogs. Bratwurst. Salted nuts and seeds. Canned beans with added salt. Canned or smoked fish. Whole eggs or egg yolks. Chicken or [place] with skin. Dairy  Whole or 2% milk, cream, and half-and-half. Whole or full-fat cream cheese. Whole-fat or sweetened yogurt. Full-fat cheese. Nondairy creamers. Whipped toppings. Processed cheese and cheese spreads. Fats and oils  Butter. Stick margarine. Lard. Shortening. Ghee. Bacon fat. Tropical oils, such as coconut, palm kernel, or palm oil. Seasoning and other foods  Salted popcorn and pretzels. Onion salt, garlic salt, seasoned salt, table salt, and sea salt. Worcestershire sauce. Tartar sauce. Barbecue sauce. Teriyaki sauce. Soy sauce, including reduced-sodium. Steak sauce. Canned and packaged gravies. Fish sauce. Oyster sauce. Cocktail sauce. Horseradish that you find on  the shelf. Ketchup. Mustard. Meat flavorings and tenderizers. Bouillon cubes. Hot sauce and Tabasco sauce. Premade or packaged marinades. Premade or packaged taco seasonings. Relishes. Regular salad dressings. Where to find more information:  National Heart, Lung, and Blood Institute: PopSteam.is  American Heart Association: www.heart.org Summary  The DASH eating plan is a healthy eating plan that has been shown to reduce high blood pressure (hypertension). It may also reduce your risk for type 2 diabetes, heart disease, and stroke.  With the DASH eating plan, you should limit salt (sodium) intake to 2,300 mg a day. If you have hypertension, you may need to reduce your sodium intake to 1,500 mg a day.  When on the DASH eating plan, aim to eat more fresh fruits and vegetables, whole grains, lean proteins, low-fat dairy, and heart-healthy fats.  Work with your health care provider or diet and nutrition specialist (dietitian) to adjust your eating plan to your individual calorie needs. This information is not intended to replace advice given to you by your health care provider. Make sure you discuss any questions you have with your health care provider. Document Released: 05/13/2011 Document Revised: 05/17/2016 Document Reviewed: 05/17/2016 Elsevier Interactive Patient Education  2017 [employer].

## 2016-10-06 NOTE — Progress Notes (Signed)
Pre visit review using our clinic review tool, if applicable. No additional management support is needed unless otherwise documented below in the visit note. 

## 2016-10-08 ENCOUNTER — Telehealth: Payer: Self-pay | Admitting: *Deleted

## 2016-10-08 NOTE — Telephone Encounter (Signed)
[facility] @ [phone]) asking the pt's daughter to RTC regarding the PA for the BS lancets and strips.//AB/CMA

## 2016-10-11 NOTE — Telephone Encounter (Signed)
Spoke with the pt's daughter and informed her that we received fax from the pharmacy requesting PA for the OneTouch lancets and strips.  Informed her that the pt's plan does not cover lancets and strips.  Informed her that she can contact the pt's insurance and ask which meter,lancets, and strips the pt's plan will cover.  She verbalized understanding and agreed.  She also stated that she will contact the pharmacy to see how much the lancets and strips cost and she may just pay for them out of pocket.//AB/CMA

## 2016-10-19 ENCOUNTER — Telehealth: Payer: Self-pay | Admitting: Family Medicine

## 2016-10-19 NOTE — Telephone Encounter (Signed)
Pt daughter Montel Culveriekeya dropped of FMLA papers for [employer]. Call daughter [phone] when complete. Placed forms in front office bin.

## 2016-10-20 ENCOUNTER — Telehealth: Payer: Self-pay | Admitting: *Deleted

## 2016-10-20 NOTE — Telephone Encounter (Signed)
Called and spoke with the pt's daughter and informed her that I received the FMLA paperwork.  Informed her that Dr. Carmelia RollerWendling is out of the office and will not return until (11/05/16).  She stated that the FMLA paperwork is due (10/31/16).  Informed her that we might could ask another provider to fill it out but another provider will not fill out the form without seeing the pt first.  I told her that I can check to see if another provider will be willing to see the pt and fill out the form.  Speak with Ashlee regarding the FMLA  and she stated that may be could call HR and speak with the manger and let them know that the provider is out of the office and see if they will be willing to give us an extension on the FMLA paperwork.  Informed the daughter of Ashlee's recommendation, and she gave me the name and number of the manager at [employer] Selena Batten(Kim [phone]), and the number to [employer] [phone]).  Informed the daughter that I will give Selena BattenKim a call first to see if I could find out who is the pt's Case Manager.  She agreed.  Called [employer] to speak with Selena BattenKim but she will be out of the office until this (Friday).  Called [employer] and speak with Judeth CornfieldStephanie and explained to her the situation.  She talk with the pt's Case Manager and they give us a 7 day extension for the [facility].  The FMLA paperwork will be due on (11/07/16).  Called and spoke  with the pt's daughter and informed her of the extension.  She stated that she spoke with Selena BattenKim and she said we can call her on her cell phone.  Informed her that I have the extension, and as soon as Dr. Carmelia RollerWendling returns I will give him the FMLA paperwork for him to fill out and then get it faxed to [employer].   She verbalized understood.//AB/CMA

## 2016-10-20 NOTE — Telephone Encounter (Signed)
Appointment tomorrow at 1:30. 30 minute is fine. If feels worse tonight then ED evaluation. If sugars over 400 then ED evaluation. Advise to check her sugar.

## 2016-10-20 NOTE — Telephone Encounter (Signed)
Called and spoke with the pt's daughter and she stated that the pt is on Metformin and it is causing nausea and dizziness.  She stated that the pt is unable to stand no longer then 10 mins and then she has to sit down, and she is also unable to eat.  She stated that the pt was asked to increase the dose every week.  The more she increase the medication the worse the nausea and dizziness gets.  Please advise.//AB/CMA

## 2016-10-21 ENCOUNTER — Inpatient Hospital Stay (HOSPITAL_BASED_OUTPATIENT_CLINIC_OR_DEPARTMENT_OTHER)
Admission: EM | Admit: 2016-10-21 | Discharge: 2016-10-23 | DRG: 066 | Disposition: A | Discharge status: Home / Self Care | Payer: 59 | Attending: Internal Medicine | Admitting: Internal Medicine

## 2016-10-21 ENCOUNTER — Emergency Department (HOSPITAL_BASED_OUTPATIENT_CLINIC_OR_DEPARTMENT_OTHER): Payer: 59

## 2016-10-21 ENCOUNTER — Ambulatory Visit: Payer: 59 | Admitting: Medical

## 2016-10-21 ENCOUNTER — Encounter (HOSPITAL_BASED_OUTPATIENT_CLINIC_OR_DEPARTMENT_OTHER): Payer: Self-pay | Admitting: *Deleted

## 2016-10-21 DIAGNOSIS — D509 Iron deficiency anemia, unspecified: Secondary | ICD-10-CM

## 2016-10-21 DIAGNOSIS — Z886 Allergy status to analgesic agent status: Secondary | ICD-10-CM

## 2016-10-21 DIAGNOSIS — I63532 Cerebral infarction due to unspecified occlusion or stenosis of left posterior cerebral artery: Principal | ICD-10-CM | POA: Diagnosis present

## 2016-10-21 DIAGNOSIS — I10 Essential (primary) hypertension: Secondary | ICD-10-CM

## 2016-10-21 DIAGNOSIS — Z79899 Other long term (current) drug therapy: Secondary | ICD-10-CM

## 2016-10-21 DIAGNOSIS — E119 Type 2 diabetes mellitus without complications: Secondary | ICD-10-CM | POA: Diagnosis present

## 2016-10-21 DIAGNOSIS — Z8673 Personal history of transient ischemic attack (TIA), and cerebral infarction without residual deficits: Secondary | ICD-10-CM

## 2016-10-21 DIAGNOSIS — I679 Cerebrovascular disease, unspecified: Secondary | ICD-10-CM

## 2016-10-21 DIAGNOSIS — Z7902 Long term (current) use of antithrombotics/antiplatelets: Secondary | ICD-10-CM

## 2016-10-21 DIAGNOSIS — R413 Other amnesia: Secondary | ICD-10-CM | POA: Diagnosis present

## 2016-10-21 DIAGNOSIS — Z7982 Long term (current) use of aspirin: Secondary | ICD-10-CM

## 2016-10-21 DIAGNOSIS — E785 Hyperlipidemia, unspecified: Secondary | ICD-10-CM | POA: Diagnosis present

## 2016-10-21 DIAGNOSIS — Z7984 Long term (current) use of oral hypoglycemic drugs: Secondary | ICD-10-CM

## 2016-10-21 DIAGNOSIS — H5340 Unspecified visual field defects: Secondary | ICD-10-CM | POA: Diagnosis present

## 2016-10-21 DIAGNOSIS — I639 Cerebral infarction, unspecified: Secondary | ICD-10-CM

## 2016-10-21 DIAGNOSIS — H53001 Unspecified amblyopia, right eye: Secondary | ICD-10-CM | POA: Diagnosis present

## 2016-10-21 DIAGNOSIS — E1165 Type 2 diabetes mellitus with hyperglycemia: Secondary | ICD-10-CM

## 2016-10-21 HISTORY — DX: Transient cerebral ischemic attack, unspecified: G45.9

## 2016-10-21 HISTORY — DX: Type 2 diabetes mellitus without complications: E11.9

## 2016-10-21 LAB — DIFFERENTIAL
BASOS PCT: 1 %
Basophils Absolute: 0.1 10*3/uL (ref 0.0–0.1)
EOS ABS: 0.1 10*3/uL (ref 0.0–0.7)
Eosinophils Relative: 1 %
LYMPHS ABS: 1.5 10*3/uL (ref 0.7–4.0)
Lymphocytes Relative: 16 %
MONO ABS: 0.5 10*3/uL (ref 0.1–1.0)
MONOS PCT: 6 %
Neutro Abs: 7 10*3/uL (ref 1.7–7.7)
Neutrophils Relative %: 76 %

## 2016-10-21 LAB — CBC
HEMATOCRIT: 34.8 % — AB (ref 36.0–46.0)
HEMOGLOBIN: 11.6 g/dL — AB (ref 12.0–15.0)
MCH: 23.8 pg — AB (ref 26.0–34.0)
MCHC: 33.3 g/dL (ref 30.0–36.0)
MCV: 71.5 fL — AB (ref 78.0–100.0)
Platelets: 363 10*3/uL (ref 150–400)
RBC: 4.87 MIL/uL (ref 3.87–5.11)
RDW: 16.5 % — AB (ref 11.5–15.5)
WBC: 9.1 10*3/uL (ref 4.0–10.5)

## 2016-10-21 LAB — URINALYSIS, ROUTINE W REFLEX MICROSCOPIC
Bilirubin Urine: NEGATIVE
GLUCOSE, UA: NEGATIVE mg/dL
Hgb urine dipstick: NEGATIVE
KETONES UR: 15 mg/dL — AB
NITRITE: NEGATIVE
PH: 5 (ref 5.0–8.0)
Protein, ur: NEGATIVE mg/dL
SPECIFIC GRAVITY, URINE: 1.028 (ref 1.005–1.030)

## 2016-10-21 LAB — RAPID URINE DRUG SCREEN, HOSP PERFORMED
AMPHETAMINES: NOT DETECTED
Barbiturates: NOT DETECTED
Benzodiazepines: NOT DETECTED
COCAINE: NOT DETECTED
OPIATES: NOT DETECTED
TETRAHYDROCANNABINOL: NOT DETECTED

## 2016-10-21 LAB — COMPREHENSIVE METABOLIC PANEL
ALT: 14 U/L (ref 14–54)
AST: 22 U/L (ref 15–41)
Albumin: 3.9 g/dL (ref 3.5–5.0)
Alkaline Phosphatase: 70 U/L (ref 38–126)
Anion gap: 12 (ref 5–15)
BILIRUBIN TOTAL: 0.4 mg/dL (ref 0.3–1.2)
BUN: 20 mg/dL (ref 6–20)
CHLORIDE: 101 mmol/L (ref 101–111)
CO2: 23 mmol/L (ref 22–32)
CREATININE: 0.63 mg/dL (ref 0.44–1.00)
Calcium: 9.4 mg/dL (ref 8.9–10.3)
Glucose, Bld: 163 mg/dL — ABNORMAL HIGH (ref 65–99)
POTASSIUM: 3.6 mmol/L (ref 3.5–5.1)
Sodium: 136 mmol/L (ref 135–145)
Total Protein: 7.9 g/dL (ref 6.5–8.1)

## 2016-10-21 LAB — URINALYSIS, MICROSCOPIC (REFLEX)

## 2016-10-21 LAB — APTT: aPTT: 34 seconds (ref 24–36)

## 2016-10-21 LAB — TROPONIN I: Troponin I: <0.03 ng/mL (ref <0.03)

## 2016-10-21 LAB — ETHANOL

## 2016-10-21 LAB — GLUCOSE, CAPILLARY: Glucose-Capillary: 156 mg/dL — ABNORMAL HIGH (ref 65–99)

## 2016-10-21 LAB — PROTIME-INR
INR: 1.04
Prothrombin Time: 13.6 seconds (ref 11.4–15.2)

## 2016-10-21 MED ORDER — ACETAMINOPHEN 160 MG/5ML PO SOLN: 650.0000 mg | ORAL | Status: DC | PRN

## 2016-10-21 MED ORDER — INSULIN ASPART 100 UNIT/ML ~~LOC~~ SOLN
0.0000 [IU] | Freq: Every day | SUBCUTANEOUS | Status: DC
  Administered 2016-10-22: 2 [IU] via SUBCUTANEOUS

## 2016-10-21 MED ORDER — ATORVASTATIN CALCIUM 80 MG PO TABS
80.0000 mg | ORAL_TABLET | Freq: Every day | ORAL | Status: DC
  Administered 2016-10-21 – 2016-10-22 (×2): 80 mg via ORAL
  Filled 2016-10-21 (×2): qty 1

## 2016-10-21 MED ORDER — ACETAMINOPHEN 650 MG RE SUPP: 650.0000 mg | RECTAL | Status: DC | PRN

## 2016-10-21 MED ORDER — ENOXAPARIN SODIUM 40 MG/0.4ML ~~LOC~~ SOLN
40.0000 mg | SUBCUTANEOUS | Status: DC
  Administered 2016-10-22 – 2016-10-23 (×2): 40 mg via SUBCUTANEOUS
  Filled 2016-10-21 (×2): qty 0.4

## 2016-10-21 MED ORDER — INSULIN ASPART 100 UNIT/ML ~~LOC~~ SOLN
0.0000 [IU] | Freq: Three times a day (TID) | SUBCUTANEOUS | Status: DC
  Administered 2016-10-22: 5 [IU] via SUBCUTANEOUS
  Administered 2016-10-22 (×2): 3 [IU] via SUBCUTANEOUS
  Administered 2016-10-23: 5 [IU] via SUBCUTANEOUS
  Administered 2016-10-23: 3 [IU] via SUBCUTANEOUS

## 2016-10-21 MED ORDER — SENNOSIDES-DOCUSATE SODIUM 8.6-50 MG PO TABS: 1.0000 | ORAL_TABLET | Freq: Every evening | ORAL | Status: DC | PRN

## 2016-10-21 MED ORDER — AMLODIPINE BESYLATE 5 MG PO TABS
5.0000 mg | ORAL_TABLET | Freq: Every day | ORAL | Status: DC
  Administered 2016-10-22 – 2016-10-23 (×2): 5 mg via ORAL
  Filled 2016-10-21 (×3): qty 1

## 2016-10-21 MED ORDER — CLOPIDOGREL BISULFATE 75 MG PO TABS
75.0000 mg | ORAL_TABLET | Freq: Every day | ORAL | Status: DC
  Administered 2016-10-22 – 2016-10-23 (×2): 75 mg via ORAL
  Filled 2016-10-21 (×2): qty 1

## 2016-10-21 MED ORDER — AMLODIPINE BESYLATE-VALSARTAN 5-320 MG PO TABS: 1.0000 | ORAL_TABLET | Freq: Every day | ORAL | Status: DC

## 2016-10-21 MED ORDER — ASPIRIN EC 81 MG PO TBEC
81.0000 mg | DELAYED_RELEASE_TABLET | Freq: Every day | ORAL | Status: DC
  Administered 2016-10-22 – 2016-10-23 (×2): 81 mg via ORAL
  Filled 2016-10-21 (×2): qty 1

## 2016-10-21 MED ORDER — IRBESARTAN 300 MG PO TABS
300.0000 mg | ORAL_TABLET | Freq: Every day | ORAL | Status: DC
  Administered 2016-10-22 – 2016-10-23 (×2): 300 mg via ORAL
  Filled 2016-10-21 (×2): qty 1

## 2016-10-21 MED ORDER — STROKE: EARLY STAGES OF RECOVERY BOOK
Freq: Once | Status: AC
  Administered 2016-10-22: 05:00:00

## 2016-10-21 MED ORDER — ACETAMINOPHEN 325 MG PO TABS
650.0000 mg | ORAL_TABLET | ORAL | Status: DC | PRN
  Administered 2016-10-22 (×2): 650 mg via ORAL
  Filled 2016-10-21 (×2): qty 2

## 2016-10-21 NOTE — ED Triage Notes (Signed)
Patient and daughter states that at 1600 yesterday, the patient c/o dizziness.Alexis Bray.  Describes the dizziness as when first standing and goes away while walking.  Today at 1400 patient called her daughter and c/o of vision changes.  Patient is one month and two weeks recent new onset of TIA's with residual memory loss.

## 2016-10-21 NOTE — ED Notes (Signed)
ED Provider at bedside. 

## 2016-10-21 NOTE — Consult Note (Signed)
Admission H&P    Chief Complaint: New onset visual changes.  HPI: Alexis Bray is an 61 y.o. female with a history of TIA, diabetes mellitus and hypertension admitted from [facility] for new onset visual changes involving right visual field. Onset of symptoms was unclear based on medical record notes. CT scan of her head showed left PCA territory watershed subacute to chronic infarctions. No clear acute intercranial lesion was noted. Patient had a recent TIA and reportedly has had residual memory difficulty. She's been taking aspirin and Plavix daily. She has not experienced a change in speech and no facial droop has been reported. She's had no weakness and numbness involving extremities.  LSN: Unclear and possibly on 10/20/2016 tPA Given: No: Unclear time of onset of symptoms mRankin:  Past Medical History:  Diagnosis Date  . Diabetes mellitus without complication ([place])   . History of chicken pox   . Hypertension   . Prediabetes   . TIA (transient ischemic attack)    09/2016, 10/2016    Past Surgical History:  Procedure Laterality Date  . Ankles surgery Right 2005  . CESAREAN SECTION    . EYE SURGERY      Family History  Problem Relation Age of Onset  . Adopted: Yes   Social History:  reports that she has never smoked. She has never used smokeless tobacco. She reports that she drinks alcohol. She reports that she does not use drugs.  Allergies:  Allergies  Allergen Reactions  . Aspirin Nausea And Vomiting    Medications Prior to Admission  Medication Sig Dispense Refill  . amLODipine-valsartan (EXFORGE) 5-320 MG tablet Take 1 tablet by mouth daily. 30 tablet 2  . aspirin EC 81 MG tablet Take 81 mg by mouth daily.    Marland Kitchen atorvastatin (LIPITOR) 80 MG tablet Take 1 tablet (80 mg total) by mouth daily. 90 tablet 3  . clopidogrel (PLAVIX) 75 MG tablet Take 1 tablet (75 mg total) by mouth daily. 30 tablet 3  . glucose blood (ONETOUCH VERIO) test strip Check blood sugar 2-3 times a week.   Dx:E11.65 100 each 12  . metFORMIN (GLUCOPHAGE) 500 MG tablet Week 1: 1 tab daily Week 2: 1 tab twice daily Week 3: 2 tabs in AM, 1 in evening Week 4: 2 tabs twice daily 120 tablet 1  . ONETOUCH DELICA LANCETS FINE MISC Check blood sugar 2-3 times a week.  Dx:E11.65 100 each 12    ROS: Unavailable due to patient's memory difficulty.  Physical Examination: Blood pressure (!) 159/78, pulse 80, temperature 98.1 F (36.7 C), resp. rate 20, height 5' 4"  (1.626 m), weight 63.5 kg (140 lb), SpO2 100 %.  HEENT-  Normocephalic, no lesions, without obvious abnormality.  Normal external eye and conjunctiva.  Normal TM's bilaterally.  Normal auditory canals and external ears. Normal external nose, mucus membranes and septum.  Normal pharynx. Neck supple with no masses, nodes, nodules or enlargement. Cardiovascular - regular rate and rhythm, S1, S2 normal, no murmur, click, rub or gallop Lungs - chest clear, no wheezing, rales, normal symmetric air entry Abdomen - soft, non-tender; bowel sounds normal; no masses,  no organomegaly Extremities - no joint deformities, effusion, or inflammation and no edema  Neurologic Examination: Mental Status: Alert, disoriented to current age and month, no acute distress.  Speech fluent without evidence of aphasia. Able to follow commands without difficulty. Significant short-term and long-term memory difficulty noted Cranial Nerves: II-dense right homonymous hemianopsia l. III/IV/VI-Pupils were equal and reacted. Extraocular movements were  full and conjugate.    V/VII-no facial numbness and no facial weakness. VIII-normal. X-normal speech and symmetrical palatal movement. XI: trapezius strength/neck flexion strength normal bilaterally XII-midline tongue extension with normal strength. Motor: 5/5 bilaterally with normal tone and bulk Sensory: Normal throughout. Deep Tendon Reflexes: 1+ and symmetric. Plantars: Flexor bilaterally Cerebellar: Normal finger-to-nose  testing. Carotid auscultation: Normal  Results for orders placed or performed during the hospital encounter of 10/21/16 (from the past 48 hour(s))  Ethanol     Status: None   Collection Time: 10/21/16  4:47 PM  Result Value Ref Range   Alcohol, Ethyl (B) <5 <5 mg/dL    Comment:        LOWEST DETECTABLE LIMIT FOR SERUM ALCOHOL IS 5 mg/dL FOR MEDICAL PURPOSES ONLY   Protime-INR     Status: None   Collection Time: 10/21/16  4:47 PM  Result Value Ref Range   Prothrombin Time 13.6 11.4 - 15.2 seconds   INR 1.04   APTT     Status: None   Collection Time: 10/21/16  4:47 PM  Result Value Ref Range   aPTT 34 24 - 36 seconds  CBC     Status: Abnormal   Collection Time: 10/21/16  4:47 PM  Result Value Ref Range   WBC 9.1 4.0 - 10.5 K/uL   RBC 4.87 3.87 - 5.11 MIL/uL   Hemoglobin 11.6 (L) 12.0 - 15.0 g/dL   HCT 34.8 (L) 36.0 - 46.0 %   MCV 71.5 (L) 78.0 - 100.0 fL   MCH 23.8 (L) 26.0 - 34.0 pg   MCHC 33.3 30.0 - 36.0 g/dL   RDW 16.5 (H) 11.5 - 15.5 %   Platelets 363 150 - 400 K/uL  Differential     Status: None   Collection Time: 10/21/16  4:47 PM  Result Value Ref Range   Neutrophils Relative % 76 %   Neutro Abs 7.0 1.7 - 7.7 K/uL   Lymphocytes Relative 16 %   Lymphs Abs 1.5 0.7 - 4.0 K/uL   Monocytes Relative 6 %   Monocytes Absolute 0.5 0.1 - 1.0 K/uL   Eosinophils Relative 1 %   Eosinophils Absolute 0.1 0.0 - 0.7 K/uL   Basophils Relative 1 %   Basophils Absolute 0.1 0.0 - 0.1 K/uL  Comprehensive metabolic panel     Status: Abnormal   Collection Time: 10/21/16  4:47 PM  Result Value Ref Range   Sodium 136 135 - 145 mmol/L   Potassium 3.6 3.5 - 5.1 mmol/L   Chloride 101 101 - 111 mmol/L   CO2 23 22 - 32 mmol/L   Glucose, Bld 163 (H) 65 - 99 mg/dL   BUN 20 6 - 20 mg/dL   Creatinine, Ser 0.63 0.44 - 1.00 mg/dL   Calcium 9.4 8.9 - 10.3 mg/dL   Total Protein 7.9 6.5 - 8.1 g/dL   Albumin 3.9 3.5 - 5.0 g/dL   AST 22 15 - 41 U/L   ALT 14 14 - 54 U/L   Alkaline  Phosphatase 70 38 - 126 U/L   Total Bilirubin 0.4 0.3 - 1.2 mg/dL   GFR calc non Af Amer >60 >60 mL/min   GFR calc Af Amer >60 >60 mL/min    Comment: (NOTE) The eGFR has been calculated using the CKD EPI equation. This calculation has not been validated in all clinical situations. eGFR's persistently <60 mL/min signify possible Chronic Kidney Disease.    Anion gap 12 5 - 15  Troponin I  Status: None   Collection Time: 10/21/16  4:47 PM  Result Value Ref Range   Troponin I <0.03 <0.03 ng/mL  Urine rapid drug screen (hosp performed)not at [facility]     Status: None   Collection Time: 10/21/16  5:28 PM  Result Value Ref Range   Opiates NONE DETECTED NONE DETECTED   Cocaine NONE DETECTED NONE DETECTED   Benzodiazepines NONE DETECTED NONE DETECTED   Amphetamines NONE DETECTED NONE DETECTED   Tetrahydrocannabinol NONE DETECTED NONE DETECTED   Barbiturates NONE DETECTED NONE DETECTED    Comment:        DRUG SCREEN FOR MEDICAL PURPOSES ONLY.  IF CONFIRMATION IS NEEDED FOR ANY PURPOSE, NOTIFY LAB WITHIN 5 DAYS.        LOWEST DETECTABLE LIMITS FOR URINE DRUG SCREEN Drug Class       Cutoff (ng/mL) Amphetamine      1000 Barbiturate      200 Benzodiazepine   742 Tricyclics       595 Opiates          300 Cocaine          300 THC              50   Urinalysis, Routine w reflex microscopic     Status: Abnormal   Collection Time: 10/21/16  5:28 PM  Result Value Ref Range   Color, Urine YELLOW YELLOW   APPearance CLOUDY (A) CLEAR   Specific Gravity, Urine 1.028 1.005 - 1.030   pH 5.0 5.0 - 8.0   Glucose, UA NEGATIVE NEGATIVE mg/dL   Hgb urine dipstick NEGATIVE NEGATIVE   Bilirubin Urine NEGATIVE NEGATIVE   Ketones, ur 15 (A) NEGATIVE mg/dL   Protein, ur NEGATIVE NEGATIVE mg/dL   Nitrite NEGATIVE NEGATIVE   Leukocytes, UA LARGE (A) NEGATIVE  Urinalysis, Microscopic (reflex)     Status: Abnormal   Collection Time: 10/21/16  5:28 PM  Result Value Ref Range   RBC / HPF 0-5 0 - 5  RBC/hpf   WBC, UA 6-30 0 - 5 WBC/hpf   Bacteria, UA FEW (A) NONE SEEN   Squamous Epithelial / LPF 0-5 (A) NONE SEEN   Urine-Other MUCOUS PRESENT    Ct Head Wo Contrast  Result Date: 10/21/2016 CLINICAL DATA:  RIGHT vision changes and dizziness today. History of stroke, hypertension, diabetes and 9 stroke 3. EXAM: CT HEAD WITHOUT CONTRAST TECHNIQUE: Contiguous axial images were obtained from the base of the skull through the vertex without intravenous contrast. COMPARISON:  MRI of the head October 02, 2016 FINDINGS: BRAIN: No intraparenchymal hemorrhage, mass effect nor midline shift. The ventricles and sulci are normal for age. Patchy supratentorial white matter hypodensities within normal range for patient's age, though non-specific are most compatible with chronic small vessel ischemic disease. Subacute to chronic LEFT parietal occipital lobe infarcts with linear density in LEFT mesial parietal occipital lobe cortex consistent with laminar necrosis and mineralization. No acute large vascular territory infarcts. No abnormal extra-axial fluid collections. Basal cisterns are patent. VASCULAR: Mild calcific atherosclerosis of the carotid siphons. SKULL: No skull fracture. No significant scalp soft tissue swelling. SINUSES/ORBITS: Severe RIGHT paranasal sinusitis.RIGHT mastoid effusion. The included ocular globes and orbital contents are non-suspicious. OTHER: None. IMPRESSION: No acute intracranial process. Subacute to chronic LEFT PCA/posterior with watershed territory infarcts. Electronically Signed   By: Elon Alas M.D.   On: 10/21/2016 17:31    Assessment: 61 y.o. female with multiple risk factors for stroke presenting with probable acute recurrent left PCA  territory ischemic stroke. Patient appears to also have significant cognitive impairment, the etiology of which is unclear.  Stroke Risk Factors - diabetes mellitus and hypertension  Plan: 1. HgbA1c, fasting lipid panel 2. MRI, MRA  of the  brain without contrast 3. PT consult, OT consult 4. Echocardiogram 5. Carotid dopplers 6. Prophylactic therapy-Aspirin and Plavix 7. Risk factor modification 8. Telemetry monitoring 9. Vitamin B-12 and folate levels, TSH, RPR 10. EEG, routine adult study  C.R. Nicole Kindred, MD [facility] [phone]  10/21/2016, 10:11 PM

## 2016-10-21 NOTE — ED Provider Notes (Signed)
[facility] Provider Note   CSN: [id number] Arrival date & time: 10/21/16  1544     History   Chief Complaint Chief Complaint  Patient presents with  . Dizziness    HPI Alexis Bray is a 61 y.o. female.  The history is provided by the patient, a relative and medical records.  Neurologic Problem  This is a new problem. The current episode started 12 to 24 hours ago. The problem occurs constantly. The problem has not changed since onset.Pertinent negatives include no chest pain, no abdominal pain, no headaches and no shortness of breath. Associated symptoms comments: R vision changes and dizziness. Nothing aggravates the symptoms. Nothing relieves the symptoms. She has tried nothing for the symptoms. The treatment provided no relief.    Past Medical History:  Diagnosis Date  . Diabetes mellitus without complication (HCC)   . History of chicken pox   . Hypertension   . Prediabetes   . TIA (transient ischemic attack)    09/2016, 10/2016    There are no active problems to display for this patient.   Past Surgical History:  Procedure Laterality Date  . Ankles surgery Right 2005  . CESAREAN SECTION    . EYE SURGERY      OB History    No data available       Home Medications    Prior to Admission medications   Medication Sig Start Date End Date Taking? Authorizing Provider  amLODipine-valsartan (EXFORGE) 5-320 MG tablet Take 1 tablet by mouth daily. 10/06/16  Yes Sharlene Dory, DO  aspirin EC 81 MG tablet Take 81 mg by mouth daily.   Yes [provider]  atorvastatin (LIPITOR) 80 MG tablet Take 1 tablet (80 mg total) by mouth daily. 10/06/16  Yes Sharlene Dory, DO  clopidogrel (PLAVIX) 75 MG tablet Take 1 tablet (75 mg total) by mouth daily. 10/04/16  Yes Wendling, Jilda Roche, DO  glucose blood (ONETOUCH VERIO) test strip Check blood sugar 2-3 times a week.  Dx:E11.65 10/06/16  Yes Wendling, Jilda Roche, DO  metFORMIN (GLUCOPHAGE)  500 MG tablet Week 1: 1 tab daily Week 2: 1 tab twice daily Week 3: 2 tabs in AM, 1 in evening Week 4: 2 tabs twice daily 10/06/16  Yes Wendling, Jilda Roche, DO  ONETOUCH DELICA LANCETS FINE MISC Check blood sugar 2-3 times a week.  Dx:E11.65 10/06/16  Yes Wendling, Jilda Roche, DO    Family History Family History  Problem Relation Age of Onset  . Adopted: Yes    Social History Social History  Substance Use Topics  . Smoking status: Never Smoker  . Smokeless tobacco: Never Used  . Alcohol use Yes     Comment: 1-2 per week     Allergies   Aspirin   Review of Systems Review of Systems  Constitutional: Negative for chills, diaphoresis, fatigue and fever.  HENT: Negative for congestion, rhinorrhea and sneezing.   Eyes: Positive for visual disturbance.  Respiratory: Negative for choking, chest tightness, shortness of breath and wheezing.   Cardiovascular: Negative for chest pain, palpitations and leg swelling.  Gastrointestinal: Negative for abdominal pain, diarrhea, nausea and vomiting.  Genitourinary: Negative for dysuria, enuresis and flank pain.  Musculoskeletal: Negative for back pain, neck pain and neck stiffness.  Skin: Negative for rash.  Neurological: Positive for dizziness. Negative for seizures, weakness, light-headedness, numbness and headaches.  Psychiatric/Behavioral: Negative for agitation.  All other systems reviewed and are negative.    Physical Exam Updated Vital  Signs BP (!) 161/79 (BP Location: Left Arm)   Pulse 96   Temp 98.6 F (37 C) (Oral)   Resp 10   Ht 5\' 4"  (1.626 m)   Wt 140 lb (63.5 kg)   LMP  (Exact Date)   SpO2 100%   BMI 24.03 kg/m   Physical Exam  Constitutional: She is oriented to person, place, and time. She appears well-developed and well-nourished. No distress.  HENT:  Head: Normocephalic and atraumatic.  Mouth/Throat: Oropharynx is clear and moist. No oropharyngeal exudate.  Eyes: Conjunctivae and EOM are normal. Pupils are  equal, round, and reactive to light.  Neck: Normal range of motion. Neck supple.  Cardiovascular: Normal rate, regular rhythm and intact distal pulses.   No murmur heard. Pulmonary/Chest: Effort normal and breath sounds normal. No stridor. No respiratory distress. She has no wheezes. She exhibits no tenderness.  Abdominal: Soft. There is no tenderness.  Musculoskeletal: She exhibits no edema.  Neurological: She is alert and oriented to person, place, and time. She is not disoriented. She displays normal reflexes. No sensory deficit. She exhibits normal muscle tone. Coordination normal. GCS eye subscore is 4. GCS verbal subscore is 5. GCS motor subscore is 6.  Poor vision in right eye at baseline, Difficult to assess.  Left eye right-sided visual fields decreased. Normal extraocular movement. Pupils are reactive bilaterally. No nystagmus. Normal sensation in all extremities. Normal grip strength bilaterally. Normal coordination with fingernails finger. Due to dizziness, gait testing deferred at this time. No facial dropp or  speech abnormalities.  Skin: Skin is warm and dry. No rash noted. No erythema.  Psychiatric: She has a normal mood and affect.  Nursing note and vitals reviewed.    ED Treatments / Results  Labs (all labs ordered are listed, but only abnormal results are displayed) Labs Reviewed  CBC - Abnormal; Notable for the following:       Result Value   Hemoglobin 11.6 (*)    HCT 34.8 (*)    MCV 71.5 (*)    MCH 23.8 (*)    RDW 16.5 (*)    All other components within normal limits  COMPREHENSIVE METABOLIC PANEL - Abnormal; Notable for the following:    Glucose, Bld 163 (*)    All other components within normal limits  URINALYSIS, ROUTINE W REFLEX MICROSCOPIC - Abnormal; Notable for the following:    APPearance CLOUDY (*)    Ketones, ur 15 (*)    Leukocytes, UA LARGE (*)    All other components within normal limits  URINALYSIS, MICROSCOPIC (REFLEX) - Abnormal; Notable for  the following:    Bacteria, UA FEW (*)    Squamous Epithelial / LPF 0-5 (*)    All other components within normal limits  CBC - Abnormal; Notable for the following:    Hemoglobin 11.0 (*)    HCT 34.7 (*)    MCV 72.1 (*)    MCH 22.9 (*)    RDW 16.1 (*)    All other components within normal limits  COMPREHENSIVE METABOLIC PANEL - Abnormal; Notable for the following:    Glucose, Bld 192 (*)    All other components within normal limits  GLUCOSE, CAPILLARY - Abnormal; Notable for the following:    Glucose-Capillary 156 (*)    All other components within normal limits  GLUCOSE, CAPILLARY - Abnormal; Notable for the following:    Glucose-Capillary 169 (*)    All other components within normal limits  GLUCOSE, CAPILLARY - Abnormal; Notable for the  following:    Glucose-Capillary 185 (*)    All other components within normal limits  ETHANOL  PROTIME-INR  APTT  DIFFERENTIAL  RAPID URINE DRUG SCREEN, HOSP PERFORMED  TROPONIN I  HIV ANTIBODY (ROUTINE TESTING)  VITAMIN B12  TSH  RPR  HEMOGLOBIN A1C  FOLATE RBC    EKG  EKG Interpretation  Date/Time:  Thursday Oct 21 2016 16:07:52 EDT Ventricular Rate:  100 PR Interval:  120 QRS Duration: 90 QT Interval:  378 QTC Calculation: 487 R Axis:   108 Text Interpretation:  Normal sinus rhythm Rightward axis Marked ST abnormality, possible inferior subendocardial injury Abnormal ECG similar to prior ECG today. No STEMI. Confirmed by Theda Belfast ([id number]) on 10/21/2016 4:16:10 PM       Radiology Ct Head Wo Contrast  Result Date: 10/21/2016 CLINICAL DATA:  RIGHT vision changes and dizziness today. History of stroke, hypertension, diabetes and 9 stroke 3. EXAM: CT HEAD WITHOUT CONTRAST TECHNIQUE: Contiguous axial images were obtained from the base of the skull through the vertex without intravenous contrast. COMPARISON:  MRI of the head October 02, 2016 FINDINGS: BRAIN: No intraparenchymal hemorrhage, mass effect nor midline shift. The  ventricles and sulci are normal for age. Patchy supratentorial white matter hypodensities within normal range for patient's age, though non-specific are most compatible with chronic small vessel ischemic disease. Subacute to chronic LEFT parietal occipital lobe infarcts with linear density in LEFT mesial parietal occipital lobe cortex consistent with laminar necrosis and mineralization. No acute large vascular territory infarcts. No abnormal extra-axial fluid collections. Basal cisterns are patent. VASCULAR: Mild calcific atherosclerosis of the carotid siphons. SKULL: No skull fracture. No significant scalp soft tissue swelling. SINUSES/ORBITS: Severe RIGHT paranasal sinusitis.RIGHT mastoid effusion. The included ocular globes and orbital contents are non-suspicious. OTHER: None. IMPRESSION: No acute intracranial process. Subacute to chronic LEFT PCA/posterior with watershed territory infarcts. Electronically Signed   By: Awilda Metro M.D.   On: 10/21/2016 17:31    Procedures Procedures (including critical care time)  Medications Ordered in ED Medications  insulin aspart (novoLOG) injection 0-15 Units (3 Units Subcutaneous Given 10/22/16 1248)  insulin aspart (novoLOG) injection 0-5 Units (0 Units Subcutaneous Not Given 10/21/16 2200)  enoxaparin (LOVENOX) injection 40 mg (40 mg Subcutaneous Given 10/22/16 0932)  acetaminophen (TYLENOL) tablet 650 mg (650 mg Oral Given 10/22/16 0434)    Or  acetaminophen (TYLENOL) solution 650 mg ( Per Tube See Alternative 10/22/16 0434)    Or  acetaminophen (TYLENOL) suppository 650 mg ( Rectal See Alternative 10/22/16 0434)  senna-docusate (Senokot-S) tablet 1 tablet (not administered)  atorvastatin (LIPITOR) tablet 80 mg (80 mg Oral Given 10/21/16 2218)  clopidogrel (PLAVIX) tablet 75 mg (75 mg Oral Given 10/22/16 0933)  amLODipine (NORVASC) tablet 5 mg (5 mg Oral Given 10/22/16 0939)    And  irbesartan (AVAPRO) tablet 300 mg (300 mg Oral Given 10/22/16 0932)    aspirin EC tablet 81 mg (81 mg Oral Given 10/22/16 0944)   stroke: mapping our early stages of recovery book ( Does not apply Given 10/22/16 0432)     Initial Impression / Assessment and Plan / ED Course  I have reviewed the triage vital signs and the nursing notes.  Pertinent labs & imaging results that were available during my care of the patient were reviewed by me and considered in my medical decision making (see chart for details).     Alexis Bray is a 61 y.o. female with a past medical history significant for diabetes, hypertension, and recent  TIA on aspirin and Plavix who presents with visual disturbance and dizziness. Last known normal was bedtime last night approximately 30 p.m. Patient woke up with right-sided visual abnormalities. She says that it is blurry and dark on the right side of her vision. Patient reports having right eye decreased vision at baseline and has had multiple surgeries in the past. Patient reports that her left eye is having new difficulty seeing things on the right side. She denies headaches, nausea, or vomiting. She denies diplopia. She reports that when trying to stand this morning, she felt unsteady and dizzy. She is on aspirin and Plavix that was initiated when she had TIA/stroke symptoms 1 month ago. She does report that she has been feeling slightly fatigued but denies any other complaints such as no chest pain, shortness breath or palpitations, abdominal pain, nausea, vomiting, constipation, diarrhea, or dysuria. She denies any recent traumas or falls.  Patient's daughter accompanied the patient and denies any other difficulties with her speech, facial abnormality is, or middle status changes.   History and exam are seen above. On exam, patient has decreased vision in her right lower and right upper quadrant of her vision in the left eye. Patient's right eye is poor vision at baseline. Patient has normal ocular movements. Normal sensation, strength, and  coordination on my exam. Patient was not walked yet.   Patient will have CT of the head to make sure she does not have hemorrhagic conversion of prior TIA/stroke. She will have laboratory testing. Anticipate speaking with neurology and then transfer for MRI and further workup.  CT imaging showed concern for left PCA subacute stroke. Given description of symptoms as dizziness and vision changes, this may be reactivation of prior stroke or new stroke.  No evidence of bleeding.  Neurology was called and [facility] and further stroke workup.  Patient called and admitted to hospitalist team for further management.   Final Clinical Impressions(s) / ED Diagnoses   Final diagnoses:  Arterial ischemic stroke, PCA (posterior cerebral artery), left, acute (HCC)  Arterial ischemic stroke, PCA (posterior cerebral artery), left, acute (HCC)      Clinical Impression: 1. Arterial ischemic stroke, PCA (posterior cerebral artery), left, acute (HCC)   2. Arterial ischemic stroke, PCA (posterior cerebral artery), left, acute (HCC)     [facility]: [facility]    Ahmarion Saraceno, Canary Brimhristopher J, MD 10/22/16 1347

## 2016-10-21 NOTE — ED Notes (Signed)
Teresa PeltonNiekeya Lattimore, pt's daughter contact information. [phone]

## 2016-10-21 NOTE — Progress Notes (Signed)
61 yo F with HTN, DM presents with new vision symptoms. LN yesterday evening.  Today woke with right visual field deficits (she has no vision at all in right eye).    BP (!) 161/77   Pulse 81   Temp 98.1 F (36.7 C)   Resp 19   Ht 5\' 4"  (1.626 m)   Wt 63.5 kg (140 lb)   LMP  (Exact Date)   SpO2 97%   BMI 24.03 kg/m   Na 136, K 3.6, Cr 0.63, WBC 9.1, Hgb 11.6 and microcytic  Of note, patient had an MR brain 2 weeks ago by per PCP that showed subacute L PCA territory infarcts.  Because she was asymptomatic (I calculate ABCD2 score at 3) she was started on atorvastatin, Plavix, and added BP meds and scheduled for June Neuro appointment.  D/w Neuro, Dr. Roxy Mannsster.  Will admit for workup.  To tele, observation status for now.

## 2016-10-21 NOTE — ED Notes (Signed)
Pt on monitor 

## 2016-10-21 NOTE — ED Notes (Signed)
[facility] arrived to transport pt 

## 2016-10-21 NOTE — H&P (Signed)
History and Physical  Patient Name: Alexis Bray     ZOX:[id number]    DOB: 1956/01/18    DOA: 10/21/2016 PCP: Sharlene Dory, DO   Patient coming from: Home --> [facility]     Chief Complaint: Visual field loss  HPI: Alexis Bray is a 61 y.o. female with a past medical history significant for HTN and NIDDM who presents with visual field loss.  Caveat that the patient is a poor historian, so history largely collected from notes.    She claims that some time ago, she was out eating with friends, and had "a spell" where she slid down in her seat.  They brought her to the hospital and she was told she had had a small stroke.  The patient has no other recollection of any other recent events other than that she moved down here from [place] to be near her daughter and she works at [employer] and lives alone.  From notes, the patient came to the ER three weeks ago with 3 weeks "confusion" and repeating herself.  CT head at that time was unremarkable and her neuro exam was benign.  She saw a new PCP four days later, who ordered an MR brain for memory loss, and this showed "lacte subacute infarction in left occipital lobe, subacute infarction in other areas of the left PCA territory". She was started on statin, Plavix, and additional BP medications.  Now today, it appears patient was LSN by her daughter yesterday afternoon, but that today, she woke up "dizzy", unsteady and with loss of vision on her right side (she has chronic vision loss in right eye from amblyopia).  Per report, she had no focal weakness, numbness, slurred speech.  CT head was obtained that showed subacute appearing infarcts in similar distribution to her infarcts on MR last month.  ED course: -Afebrile, heart rate 81, respirations and pulse ox normal, blood pressure 161/77 -Na 136, K 3.6, Cr 0.63, WBC 9.1K, Hgb 11.6 and microcytic -CT head performed that showed subacute to chronic left PCA watershed area infarcts -Case was  discussed with Neurology who recommended admission for stroke workup         Review of systems:  Review of Systems  Constitutional: Negative for chills, fever and malaise/fatigue.  Eyes: Positive for blurred vision.  Neurological: Negative for dizziness, tingling, tremors, sensory change, speech change, focal weakness, seizures, loss of consciousness, weakness and headaches.  Psychiatric/Behavioral: Positive for memory loss.  All other systems reviewed and are negative. ROS limited by fact that patient cannot remember.       Past Medical History:  Diagnosis Date  . Diabetes mellitus without complication (HCC)   . History of chicken pox   . Hypertension   . Prediabetes   . TIA (transient ischemic attack)    09/2016, 10/2016    Past Surgical History:  Procedure Laterality Date  . Ankles surgery Right 2005  . CESAREAN SECTION    . EYE SURGERY      Social History: Patient lives alone.  Patient walks unassisted.  She works at [employer] as a Conservation officer, nature.  She is from [place].  She moved here to be near her daughter.  Denies smoking, alcohol, IVD.    Allergies  Allergen Reactions  . Aspirin Nausea And Vomiting    Family history: family history is not on file. She was adopted.  Prior to Admission medications   Medication Sig Start Date End Date Taking? Authorizing Provider  amLODipine-valsartan (EXFORGE) 5-320 MG tablet Take 1  tablet by mouth daily. 10/06/16  Yes Sharlene Dory, DO  aspirin EC 81 MG tablet Take 81 mg by mouth daily.   Yes [provider]  atorvastatin (LIPITOR) 80 MG tablet Take 1 tablet (80 mg total) by mouth daily. 10/06/16  Yes Sharlene Dory, DO  clopidogrel (PLAVIX) 75 MG tablet Take 1 tablet (75 mg total) by mouth daily. 10/04/16  Yes Wendling, Jilda Roche, DO  glucose blood (ONETOUCH VERIO) test strip Check blood sugar 2-3 times a week.  Dx:E11.65 10/06/16  Yes Wendling, Jilda Roche, DO  metFORMIN (GLUCOPHAGE) 500 MG tablet Week  1: 1 tab daily Week 2: 1 tab twice daily Week 3: 2 tabs in AM, 1 in evening Week 4: 2 tabs twice daily 10/06/16  Yes Wendling, Jilda Roche, DO  ONETOUCH DELICA LANCETS FINE MISC Check blood sugar 2-3 times a week.  Dx:E11.65 10/06/16  Yes Sharlene Dory, DO     Physical Exam: BP (!) 152/78 (BP Location: Left Arm)   Pulse 80   Temp 99.1 F (37.3 C) (Oral)   Resp 20   Ht 5\' 4"  (1.626 m)   Wt 63.5 kg (140 lb)   LMP  (Exact Date)   SpO2 100%   BMI 24.03 kg/m  General appearance: Well-developed, adult female, alert and in no acute distress.   Eyes: Anicteric, conjunctiva pink, lids and lashes normal. PERRL.    ENT: No nasal deformity, discharge, epistaxis.  Hearing normal. OP moist without lesions.   Dentition poor. Lymph: No cervical, supraclavicular or axillary lymphadenopathy. Skin: Warm and dry.  No jaundice.  No suspicious rashes or lesions. Cardiac: RRR, nl S1-S2, no murmurs appreciated.  Capillary refill is brisk.  JVP normal.  No LE edema.  Radial and DP pulses 2+ and symmetric.  No carotid bruits. Respiratory: Normal respiratory rate and rhythm.  CTAB without rales or wheezes. GI: Abdomen soft without rigidity.  No TTP. No ascites, distension, no hepatosplenomegaly.   MSK: No deformities or effusions. Neuro: Right sided field deficits.  Pupils are 4 mm and reactive to 3 mm. Extraocular movements are intact, without nystagmus, subtle right eye dysmotility. Cranial nerve 5 is within normal limits. Cranial nerve 7 is symmetrical. Cranial nerve 8 is within normal limits. Cranial nerves 9 and 10 reveal equal palate elevation. Cranial nerve 11 reveals sternocleidomastoid strong. Cranial nerve 12 is midline. I do not note a deficit in motor strength testing in the upper and lower extremities bilaterally with normal motor, tone and bulk. Finger-to-nose testing is within normal limits. Speech is fluent. Attention span and concentration are within normal limits.   Psych: The  patient is oriented to place and person. She is not oriented to year.  Behavior appropriate.  Affect normal.  Recall, recent and remote, as well as general fund of knowledge seem markedly impaired. No evidence of aural or visual hallucinations or delusions.       Labs on Admission:  I have personally reviewed following labs and imaging studies: CBC:  Recent Labs Lab 10/21/16 1647  WBC 9.1  NEUTROABS 7.0  HGB 11.6*  HCT 34.8*  MCV 71.5*  PLT 363   Basic Metabolic Panel:  Recent Labs Lab 10/21/16 1647  NA 136  K 3.6  CL 101  CO2 23  GLUCOSE 163*  BUN 20  CREATININE 0.63  CALCIUM 9.4   GFR: Estimated Creatinine Clearance: 63.8 mL/min (by C-G formula based on SCr of 0.63 mg/dL). Liver Function Tests:  Recent Labs Lab 10/21/16 1647  AST 22  ALT 14  ALKPHOS 70  BILITOT 0.4  PROT 7.9  ALBUMIN 3.9   No results for input(s): LIPASE, AMYLASE in the last 168 hours. No results for input(s): AMMONIA in the last 168 hours. Coagulation Profile:  Recent Labs Lab 10/21/16 1647  INR 1.04   Cardiac Enzymes:  Recent Labs Lab 10/21/16 1647  TROPONINI <0.03   BNP (last 3 results) No results for input(s): PROBNP in the last 8760 hours. HbA1C: No results for input(s): HGBA1C in the last 72 hours. CBG:  Recent Labs Lab 10/21/16 2212  GLUCAP 156*   Lipid Profile: No results for input(s): CHOL, HDL, LDLCALC, TRIG, CHOLHDL, LDLDIRECT in the last 72 hours. Thyroid Function Tests: No results for input(s): TSH, T4TOTAL, FREET4, T3FREE, THYROIDAB in the last 72 hours. Anemia Panel: No results for input(s): VITAMINB12, FOLATE, FERRITIN, TIBC, IRON, RETICCTPCT in the last 72 hours. Sepsis Labs: Invalid input(s): PROCALCITONIN, LACTICIDVEN No results found for this or any previous visit (from the past 240 hour(s)).    Radiological Exams on Admission: Personally reviewed CT head report: Ct Head Wo Contrast  Result Date: 10/21/2016 CLINICAL DATA:  RIGHT vision  changes and dizziness today. History of stroke, hypertension, diabetes and 9 stroke 3. EXAM: CT HEAD WITHOUT CONTRAST TECHNIQUE: Contiguous axial images were obtained from the base of the skull through the vertex without intravenous contrast. COMPARISON:  MRI of the head October 02, 2016 FINDINGS: BRAIN: No intraparenchymal hemorrhage, mass effect nor midline shift. The ventricles and sulci are normal for age. Patchy supratentorial white matter hypodensities within normal range for patient's age, though non-specific are most compatible with chronic small vessel ischemic disease. Subacute to chronic LEFT parietal occipital lobe infarcts with linear density in LEFT mesial parietal occipital lobe cortex consistent with laminar necrosis and mineralization. No acute large vascular territory infarcts. No abnormal extra-axial fluid collections. Basal cisterns are patent. VASCULAR: Mild calcific atherosclerosis of the carotid siphons. SKULL: No skull fracture. No significant scalp soft tissue swelling. SINUSES/ORBITS: Severe RIGHT paranasal sinusitis.RIGHT mastoid effusion. The included ocular globes and orbital contents are non-suspicious. OTHER: None. IMPRESSION: No acute intracranial process. Subacute to chronic LEFT PCA/posterior with watershed territory infarcts. Electronically Signed   By: Awilda Metro M.D.   On: 10/21/2016 17:31     EKG: Independently reviewed. Rate 100, LVH with strain pattern.           Assessment/Plan Principal Problem:   Arterial ischemic stroke, PCA (posterior cerebral artery), left, acute (HCC) Active Problems:   Essential hypertension   Controlled type 2 diabetes mellitus without complication, without long-term current use of insulin (HCC)   Microcytic anemia  1. Acute Stroke:  This is new.  MRI pending.  Suspect this event was sometime in the last month?, now presenting with deficits. -Admit to telemetry -Neuro checks, NIHSS per protocol -Daily aspirin 81 mg and  continue Plavix -Lipids, hemoglobin A1c -Carotid doppler, MRA or CT angiography of head and neck per Neurology, ordered -Echocardiogram ordered -PT/OT/SLP consultation -Consult to Neurology, appreciate recommendations   2. Memory loss:  This is new.  Unclear duration.   -Check TSH, RPR -Check B12, folate  3. HTN:  Outside window for permissive HTN. -Continue amlodipine-ARB -Continue statin  4. Diabetes:  Last HgbA1c 11.9% -Hold metformin -SSI with meals        DVT prophylaxis: Lovenox  Code Status: FULL  Family Communication: None present  Disposition Plan: Anticipate Stroke work up as above and consult to ancillary services.  Expect discharge within  2-3 days. Consults called: Neurology, Dr. Roseanne RenoStewart has seen patient. Admission status: Telemetry, INPATIENT status  Core measures: -VTE prophylaxis ordered at time of admission -Aspirin ordered at admission -Atrial fibrillation: not previously known -tPA not given because of outside stroke window -Dysphagia screen ordered in ER -Lipids ordered -PT eval ordered -Non-smoker    Medical decision making: Patient seen at 9:20 PM on 10/21/2016.  The patient was discussed with Dr. Roseanne RenoStewart and Dr. Rush Landmarkegeler. What exists of the patient's chart was reviewed in depth and summarized above.  Clinical condition: stable.       Alberteen SamChristopher P Dewon Mendizabal [facility] Pager [phone]

## 2016-10-21 NOTE — ED Notes (Signed)
NT at bedside with pt 

## 2016-10-21 NOTE — ED Notes (Addendum)
Attempted to call report, informed the nurse would call back.

## 2016-10-22 ENCOUNTER — Observation Stay (HOSPITAL_BASED_OUTPATIENT_CLINIC_OR_DEPARTMENT_OTHER): Payer: 59

## 2016-10-22 ENCOUNTER — Observation Stay (HOSPITAL_COMMUNITY): Payer: 59

## 2016-10-22 ENCOUNTER — Telehealth: Payer: Self-pay | Admitting: *Deleted

## 2016-10-22 ENCOUNTER — Observation Stay (HOSPITAL_COMMUNITY)
Admit: 2016-10-22 | Discharge: 2016-10-22 | Disposition: A | Discharge status: Home / Self Care | Payer: 59 | Attending: Family Medicine | Admitting: Family Medicine

## 2016-10-22 DIAGNOSIS — E785 Hyperlipidemia, unspecified: Secondary | ICD-10-CM

## 2016-10-22 DIAGNOSIS — H5347 Heteronymous bilateral field defects: Secondary | ICD-10-CM

## 2016-10-22 DIAGNOSIS — E1159 Type 2 diabetes mellitus with other circulatory complications: Secondary | ICD-10-CM

## 2016-10-22 DIAGNOSIS — Z7982 Long term (current) use of aspirin: Secondary | ICD-10-CM | POA: Diagnosis not present

## 2016-10-22 DIAGNOSIS — H5340 Unspecified visual field defects: Secondary | ICD-10-CM | POA: Diagnosis present

## 2016-10-22 DIAGNOSIS — Z79899 Other long term (current) drug therapy: Secondary | ICD-10-CM | POA: Diagnosis not present

## 2016-10-22 DIAGNOSIS — I639 Cerebral infarction, unspecified: Secondary | ICD-10-CM

## 2016-10-22 DIAGNOSIS — R42 Dizziness and giddiness: Secondary | ICD-10-CM

## 2016-10-22 DIAGNOSIS — I5030 Unspecified diastolic (congestive) heart failure: Secondary | ICD-10-CM

## 2016-10-22 DIAGNOSIS — R413 Other amnesia: Secondary | ICD-10-CM | POA: Diagnosis present

## 2016-10-22 DIAGNOSIS — H53001 Unspecified amblyopia, right eye: Secondary | ICD-10-CM | POA: Diagnosis present

## 2016-10-22 DIAGNOSIS — Z8673 Personal history of transient ischemic attack (TIA), and cerebral infarction without residual deficits: Secondary | ICD-10-CM | POA: Diagnosis not present

## 2016-10-22 DIAGNOSIS — I63532 Cerebral infarction due to unspecified occlusion or stenosis of left posterior cerebral artery: Secondary | ICD-10-CM | POA: Diagnosis present

## 2016-10-22 DIAGNOSIS — Z886 Allergy status to analgesic agent status: Secondary | ICD-10-CM | POA: Diagnosis not present

## 2016-10-22 DIAGNOSIS — E119 Type 2 diabetes mellitus without complications: Secondary | ICD-10-CM | POA: Diagnosis present

## 2016-10-22 DIAGNOSIS — R9431 Abnormal electrocardiogram [ECG] [EKG]: Secondary | ICD-10-CM

## 2016-10-22 DIAGNOSIS — E1149 Type 2 diabetes mellitus with other diabetic neurological complication: Secondary | ICD-10-CM

## 2016-10-22 DIAGNOSIS — Z7902 Long term (current) use of antithrombotics/antiplatelets: Secondary | ICD-10-CM | POA: Diagnosis not present

## 2016-10-22 DIAGNOSIS — I10 Essential (primary) hypertension: Secondary | ICD-10-CM | POA: Diagnosis present

## 2016-10-22 DIAGNOSIS — Z7984 Long term (current) use of oral hypoglycemic drugs: Secondary | ICD-10-CM | POA: Diagnosis not present

## 2016-10-22 LAB — ECHOCARDIOGRAM COMPLETE
E decel time: 254 msec
E/e' ratio: 8.77
FS: 32 % (ref 28–44)
HEIGHTINCHES: 64 in
IVS/LV PW RATIO, ED: 1.16
LA diam end sys: 38 mm
LA vol A4C: 33.3 ml
LADIAMINDEX: 2.26 cm/m2
LASIZE: 38 mm
LAVOL: 44.1 mL
LAVOLIN: 26.3 mL/m2
LV E/e' medial: 8.77
LV PW d: 11 mm — AB (ref 0.6–1.1)
LV e' LATERAL: 5.36 cm/s
LVEEAVG: 8.77
LVOT SV: 56 mL
LVOT VTI: 22.2 cm
LVOT area: 2.54 cm2
LVOT peak vel: 97.5 cm/s
LVOTD: 18 mm
Lateral S' vel: 10.7 cm/s
MV Dec: 254
MVPKAVEL: 88.1 m/s
MVPKEVEL: 47 m/s
RV TAPSE: 15.6 mm
TDI e' lateral: 5.36
TDI e' medial: 2.61
Weight: 2240 oz

## 2016-10-22 LAB — HIV ANTIBODY (ROUTINE TESTING W REFLEX): HIV SCREEN 4TH GENERATION: NONREACTIVE

## 2016-10-22 LAB — COMPREHENSIVE METABOLIC PANEL
ALT: 15 U/L (ref 14–54)
AST: 20 U/L (ref 15–41)
Albumin: 3.8 g/dL (ref 3.5–5.0)
Alkaline Phosphatase: 65 U/L (ref 38–126)
Anion gap: 8 (ref 5–15)
BILIRUBIN TOTAL: 0.3 mg/dL (ref 0.3–1.2)
BUN: 16 mg/dL (ref 6–20)
CHLORIDE: 102 mmol/L (ref 101–111)
CO2: 25 mmol/L (ref 22–32)
Calcium: 9.2 mg/dL (ref 8.9–10.3)
Creatinine, Ser: 0.6 mg/dL (ref 0.44–1.00)
GFR calc Af Amer: >60 mL/min (ref >60)
Glucose, Bld: 192 mg/dL — ABNORMAL HIGH (ref 65–99)
POTASSIUM: 3.7 mmol/L (ref 3.5–5.1)
Sodium: 135 mmol/L (ref 135–145)
TOTAL PROTEIN: 7.5 g/dL (ref 6.5–8.1)

## 2016-10-22 LAB — CBC
HEMATOCRIT: 34.7 % — AB (ref 36.0–46.0)
Hemoglobin: 11 g/dL — ABNORMAL LOW (ref 12.0–15.0)
MCH: 22.9 pg — ABNORMAL LOW (ref 26.0–34.0)
MCHC: 31.7 g/dL (ref 30.0–36.0)
MCV: 72.1 fL — AB (ref 78.0–100.0)
PLATELETS: 316 10*3/uL (ref 150–400)
RBC: 4.81 MIL/uL (ref 3.87–5.11)
RDW: 16.1 % — AB (ref 11.5–15.5)
WBC: 6.9 10*3/uL (ref 4.0–10.5)

## 2016-10-22 LAB — TSH: TSH: 3.978 u[IU]/mL (ref 0.350–4.500)

## 2016-10-22 LAB — GLUCOSE, CAPILLARY
GLUCOSE-CAPILLARY: 169 mg/dL — AB (ref 65–99)
GLUCOSE-CAPILLARY: 223 mg/dL — AB (ref 65–99)
GLUCOSE-CAPILLARY: 222 mg/dL — AB (ref 65–99)
Glucose-Capillary: 185 mg/dL — ABNORMAL HIGH (ref 65–99)

## 2016-10-22 LAB — VITAMIN B12: Vitamin B-12: 899 pg/mL (ref 180–914)

## 2016-10-22 LAB — RPR: RPR: NONREACTIVE

## 2016-10-22 NOTE — Progress Notes (Signed)
EEG Completed; Results Pending  

## 2016-10-22 NOTE — Evaluation (Signed)
Physical Therapy Evaluation Patient Details Name: Alexis Bray MRN: [id number] DOB: [birth year] Today's Date: 10/22/2016   History of Present Illness  pt is a 61 y/o female with pmh significant for HTN and NIDDM who presents with worsening R visual field loss, dizziness and unsteadiness.  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  MRI this admission shows extension of the infarcts for recent MRI.  Clinical Impression  Pt admitted with/for worsening R visual field loss.  Pt currently limited functionally due to the problems listed below.  (see problems list.)  Pt will benefit from PT to maximize function and safety to be able to get home safely with available assist of family.     Follow Up Recommendations Outpatient PT    Equipment Recommendations  None recommended by PT    Recommendations for Other Services       Precautions / Restrictions Precautions Precautions: Fall Restrictions Weight Bearing Restrictions: No      Mobility  Bed Mobility Overal bed mobility: Independent             General bed mobility comments: able to get in bed sit to supine with no assist  Transfers Overall transfer level: Needs assistance   Transfers: Sit to/from Stand Sit to Stand: Supervision         General transfer comment: safety only  Ambulation/Gait Ambulation/Gait assistance: Min guard Ambulation Distance (Feet): 250 Feet Assistive device: None Gait Pattern/deviations: Step-through pattern   Gait velocity interpretation: at or above normal speed for age/gender General Gait Details: Generally steady.  Getting lost in unfamiliar surroundings due to missing key information on the right and having trouble processing how to find the information with multiple cues.  During this time is relatively steady on her feet.  Stairs             Wheelchair Mobility    Modified Rankin (Stroke Patients Only) Modified Rankin (Stroke Patients Only) Pre-Morbid Rankin Score: No significant disability Modified Rankin: Moderate disability     Balance Overall balance assessment: Needs assistance Sitting-balance support: No upper extremity supported;Feet supported Sitting balance-Leahy Scale: Normal     Standing balance support: No upper extremity supported;During functional activity Standing balance-Leahy Scale: Good                               Pertinent Vitals/Pain Pain Assessment: No/denies pain    Home Living Family/patient expects to be discharged to:: Private residence Living Arrangements: Alone Available Help at Discharge: Family;Available PRN/intermittently Type of Home: Apartment Home Access: Stairs to enter Entrance Stairs-Rails: Right;Left Entrance Stairs-Number of Steps: 3 flights Home Layout: One level;Other (Comment) (on 3rd floor) Home Equipment: None      Prior Function Level of Independence: Independent         Comments: Was independent and working at [employer] as Conservation officer, 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), also assists in caring for her 67 year old grandson     Hand Dominance   Dominant Hand: Right    Extremity/Trunk Assessment   Upper Extremity Assessment Upper Extremity Assessment: Overall WFL for tasks assessed    Lower Extremity Assessment Lower Extremity Assessment: Defer to PT evaluation    Cervical / Trunk Assessment Cervical / Trunk Assessment: Normal  Communication   Communication: No difficulties  Cognition Arousal/Alertness: Awake/alert Behavior During Therapy:  WFL for tasks assessed/performed Overall Cognitive Status: No family/caregiver present to determine baseline cognitive functioning                                 General Comments: Does not show awareness of missing information on the right.  Appears to be missing information in the  left field also. Pt was given the instructions to find the elevators, and told where sinage was in hospital. Pt was unable to problem solve and find elevators without max verbal cues.       General Comments      Exercises     Assessment/Plan    PT Assessment Patient needs continued PT services  PT Problem List Decreased safety awareness;Decreased mobility;Decreased cognition;Decreased knowledge of precautions;Other (comment) (decreased balance in that she doesn't appear anticipatory)       PT Treatment Interventions Gait training;Stair training;Functional mobility training;Therapeutic activities;Balance training;Patient/family education    PT Goals (Current goals can be found in the Care Plan section)  Acute Rehab PT Goals Patient Stated Goal: live by myself PT Goal Formulation: With patient Time For Goal Achievement: 11/05/16 Potential to Achieve Goals: Good    Frequency Min 3X/week   Barriers to discharge Decreased caregiver support      Co-evaluation               AM-PAC PT "6 Clicks" Daily Activity  Outcome Measure Difficulty turning over in bed (including adjusting bedclothes, sheets and blankets)?: None Difficulty moving from lying on back to sitting on the side of the bed? : None Difficulty sitting down on and standing up from a chair with arms (e.g., wheelchair, bedside commode, etc,.)?: None Help needed moving to and from a bed to chair (including a wheelchair)?: A Little Help needed walking in hospital room?: A Little Help needed climbing 3-5 steps with a railing? : A Little 6 Click Score: 21    End of Session   Activity Tolerance: Patient tolerated treatment well Patient left: Other (comment) (in hall with OT) Nurse Communication: Mobility status PT Visit Diagnosis: Other abnormalities of gait and mobility (R26.89);Other symptoms and signs involving the nervous system (R29.898)    Time: [id number] PT Time Calculation (min) (ACUTE ONLY): 26  min   Charges:   PT Evaluation $PT Eval Moderate Complexity: 1 Procedure PT Treatments $Gait Training: 8-22 mins   PT G Codes:   PT G-Codes **NOT FOR INPATIENT CLASS** Functional Assessment Tool Used: AM-PAC 6 Clicks Basic Mobility;Clinical judgement Functional Limitation: Mobility: Walking and moving around Mobility: Walking and Moving Around Current Status ([id number](G8978): At least 20 percent but less than 40 percent impaired, limited or restricted Mobility: Walking and Moving Around Goal Status [id number](G8979): At least 1 percent but less than 20 percent impaired, limited or restricted    10/22/2016  [place] BingKen Machi Whittaker, PT [phone]  (pager)  Eliseo GumKenneth V Shynice Sigel 10/22/2016, 6:41 PM

## 2016-10-22 NOTE — Telephone Encounter (Signed)
Called on (10/20/16) @ 5:28pm and LMOM informing the pt's daughter that I informed Ramon Dredgedward of the message and per Ramon Dredgedward the pt needs to be seen.  And we have scheduled the pt for this (Thurs-10/21/16 @ 1:30pm) with Ramon DredgeEdward.  Asked the daughter to please give me a call back to let me know she received the message.//AB/CMA

## 2016-10-22 NOTE — Care Management Note (Signed)
Case Management Note  Patient Details  Name: Phillis HaggisKaren Kilmartin MRN: [id number] Date of Birth: [birth year]  Subjective/Objective:    From home, patient called daughter informed her she was having visual changes, brought to ED, did not receive andy IV  t-PA, CT shows LPCA watershed infarcts.    PCP listed is Jilda RocheNicholas Paul wendling.              Action/Plan: NCM will follow for dc needs.  Expected Discharge Date:                  Expected Discharge Plan:     In-House Referral:     Discharge planning Services  CM Consult  Post Acute Care Choice:    Choice offered to:     DME Arranged:    DME Agency:     HH Arranged:    HH Agency:     Status of Service:  In process, will continue to follow  If discussed at Long Length of Stay Meetings, dates discussed:    Additional Comments:  Leone Havenaylor, Jillana Selph Clinton, RN 10/22/2016, 11:07 AM

## 2016-10-22 NOTE — Procedures (Signed)
Electroencephalogram (EEG) Report  Date of study: 10/22/16  Requesting clinician: Joen Laurahristopher Danford, M.D.  Reason for study: Evaluate for seizure  Brief clinical history: This is a 61 year old woman admitted for stroke evaluation. She's been complaining of memory deficits prompting this EEG for further evaluation.  Medications:  Current Facility-Administered Medications:  .  acetaminophen (TYLENOL) tablet 650 mg, 650 mg, Oral, Q4H PRN, 650 mg at 10/22/16 0434 **OR** acetaminophen (TYLENOL) solution 650 mg, 650 mg, Per Tube, Q4H PRN **OR** acetaminophen (TYLENOL) suppository 650 mg, 650 mg, Rectal, Q4H PRN, Danford, Earl Liteshristopher P, MD .  amLODipine (NORVASC) tablet 5 mg, 5 mg, Oral, Daily, 5 mg at 10/22/16 0939 **AND** irbesartan (AVAPRO) tablet 300 mg, 300 mg, Oral, Daily, Danford, Earl Liteshristopher P, MD, 300 mg at 10/22/16 0932 .  aspirin EC tablet 81 mg, 81 mg, Oral, Daily, Danford, Earl Liteshristopher P, MD, 81 mg at 10/22/16 0944 .  atorvastatin (LIPITOR) tablet 80 mg, 80 mg, Oral, QHS, Danford, Earl Liteshristopher P, MD, 80 mg at 10/21/16 2218 .  clopidogrel (PLAVIX) tablet 75 mg, 75 mg, Oral, Daily, Danford, Earl Liteshristopher P, MD, 75 mg at 10/22/16 0933 .  enoxaparin (LOVENOX) injection 40 mg, 40 mg, Subcutaneous, Q24H, Danford, Earl Liteshristopher P, MD, 40 mg at 10/22/16 0932 .  insulin aspart (novoLOG) injection 0-15 Units, 0-15 Units, Subcutaneous, TID WC, Danford, Earl Liteshristopher P, MD, 5 Units at 10/22/16 1707 .  insulin aspart (novoLOG) injection 0-5 Units, 0-5 Units, Subcutaneous, QHS, Danford, Christopher P, MD .  senna-docusate (Senokot-S) tablet 1 tablet, 1 tablet, Oral, QHS PRN, Danford, Earl Liteshristopher P, MD  Description: This is a routine EEG performed using standard international 10-20 electrode placement. A total of 18 channels are recorded, including one for the EKG. Wakefulness, drowsiness, and sleep are all recorded on this study.  Activating Maneuvers: Photic stimulation  Findings:  The EKG channel  demonstrates a regular rhythm with a rate of 80 beats per minute.   The background consists of well-formed alpha activity. The best dominant posterior rhythm is 9 Hz. This is symmetric and reacts as expected with eye opening.   There is a mild degree of focal intermixed slowing in the left temporal region. No epileptiform discharges are present. No seizures are recorded.   Drowsiness is recorded and is normal in appearance. Early stages of sleep are recorded and demonstrate normal architecture.    Impression:  This is a mildly abnormal EEG due to the presence of intermixed focal slowing in the left temporal region. No epileptiform abnormalities or seizures.  Clinical correlation: The presence of focal slowing in left temporal region with suggest regional cortical dysfunction in this area. Suggest clinical correlation. There is nothing to support an epileptic focus on this recording.    Rhona Leavensimothy Xavien Dauphinais, MD [facility]

## 2016-10-22 NOTE — Progress Notes (Signed)
STROKE TEAM PROGRESS NOTE   HISTORY OF PRESENT ILLNESS (per record) Alexis Bray is an 61 y.o. female with a history of TIA, diabetes mellitus and hypertension admitted from [facility] for new onset visual changes involving right visual field. Onset of symptoms was unclear based on medical record notes. CT scan of her head showed left PCA territory watershed subacute to chronic infarctions. No clear acute intercranial lesion was noted. Patient had a recent TIA and reportedly has had residual memory difficulty. She's been taking aspirin and Plavix daily. She has not experienced a change in speech and no facial droop has been reported. She's had no weakness and numbness involving extremities. His LKW is unclear, possible on 10/20/2016. Patient was not administered IV t-PA secondary to unclear time of onset of symptoms. She was admitted for further evaluation and treatment.   SUBJECTIVE (INTERVAL HISTORY) No family is at bedside. 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 She was admitted to [facility] in 09/30/16 for right PCA scattered infarct. And this time was told to go get more tests by PCP, but after MRI repeat, she was sent to ER for admission. MRI repeat did show progression from previous MRI but pt has no neuro changes since discharge. She is still driving and I told her that she can not drive due to hemianopia.    OBJECTIVE Temp:  [98.1 F (36.7 C)-99.1 F (37.3 C)] 98.2 F (36.8 C) (05/18 0939) Pulse Rate:  [67-101] 82 (05/18 0939) Cardiac Rhythm: Normal sinus rhythm (05/17 2135) Resp:  [10-20] 18 (05/18 0600) BP: (110-189)/(64-99) 150/82 (05/18 0939) SpO2:  [96 %-100 %] 96 % (05/18 0939) Weight:  [63.5 kg (140 lb)] 63.5 kg (140 lb) (05/17 1550)  CBC:  Recent Labs Lab 10/21/16 1647 10/22/16 0716  WBC 9.1 6.9  NEUTROABS 7.0  --   HGB 11.6* 11.0*  HCT 34.8* 34.7*  MCV 71.5* 72.1*  PLT 363 316    Basic Metabolic Panel:  Recent Labs Lab 10/21/16 1647 10/22/16 0716  NA 136 135  K 3.6 3.7  CL 101  102  CO2 23 25  GLUCOSE 163* 192*  BUN 20 16  CREATININE 0.63 0.60  CALCIUM 9.4 9.2    Lipid Panel:    Component Value Date/Time   CHOL 165 09/30/2016 1034   TRIG 74.0 09/30/2016 1034   HDL 50.00 09/30/2016 1034   CHOLHDL 3 09/30/2016 1034   VLDL 14.8 09/30/2016 1034   LDLCALC 101 (H) 09/30/2016 1034   HgbA1c:  Lab Results  Component Value Date   HGBA1C 11.9 (H) 09/30/2016   Urine Drug Screen:    Component Value Date/Time   LABOPIA NONE DETECTED 10/21/2016 1728   COCAINSCRNUR NONE DETECTED 10/21/2016 1728   LABBENZ NONE DETECTED 10/21/2016 1728   AMPHETMU NONE DETECTED 10/21/2016 1728   THCU NONE DETECTED 10/21/2016 1728   LABBARB NONE DETECTED 10/21/2016 1728    Alcohol Level     Component Value Date/Time   ETH <5 10/21/2016 1647    IMAGING I have personally reviewed the radiological images below and agree with the radiology interpretations.  Ct Head Wo Contrast 10/21/2016 IMPRESSION: No acute intracranial process. Subacute to chronic LEFT PCA/posterior with watershed territory infarcts.   Ct Head Wo Contrast 09/28/2016 1. No acute intracranial abnormality. Question of small right posterior limb internal capsule lacunar infarct. 2. Complete opacification of the right maxillary sinus possibly chronic in nature with some opacification of right ethmoid air cells and narrowed right nasal airway. Electronically Signed   By: Dwyane Dee  M.D.   On: 09/28/2016 16:08   Mr Brain Wo Contrast 10/02/2016 IMPRESSION: Mixed age infarctions in the left PCA territory. Infarction in the left occipital lobe looks late subacute. Infarction in the posteromedial temporal lobe and temporal occipital junction region looks subacute. Inflammatory sinus disease on the right.  Right mastoid effusion. I am in the process of calling this report. Electronically Signed   By: Paulina FusiMark  Shogry M.D.   On: 10/02/2016 19:17   Mri and Mra Head 10/22/2016 IMPRESSION: 1. Diffuse acute on chronic infarct in  the left PCA territory that has progressed from 10/02/2016. Left proximal P2 segment occlusion. 2. Advanced intracranial atherosclerosis with multiple proximal stenoses described above. 3. Postobstructive sinusitis at the level of the right middle meatus. Moderate left sphenoid sinusitis.   CUS - Bilateral - No evidence of significant ICA stenosis. Vertebral artery flow is antegrade  TTE - Left ventricle: The cavity size was normal. Wall thickness was   increased in a pattern of moderate LVH. Systolic function was   normal. The estimated ejection fraction was in the range of 60%   to 65%. Wall motion was normal; there were no regional wall   motion abnormalities. Doppler parameters are consistent with   abnormal left ventricular relaxation (grade 1 diastolic   dysfunction). Doppler parameters are consistent with high   ventricular filling pressure. - Mitral valve: Calcified annulus. Impressions: - Normal LV systolic function; mild diastolic dysfuncton with   elevated LV filling pressure; moderate LVH.  EEG pending   PHYSICAL EXAM  Temp:  [98.1 F (36.7 C)-99.1 F (37.3 C)] 99 F (37.2 C) (05/18 1348) Pulse Rate:  [67-101] 78 (05/18 1348) Resp:  [10-20] 18 (05/18 1348) BP: (110-189)/(64-99) 135/78 (05/18 1348) SpO2:  [96 %-100 %] 98 % (05/18 1348) Weight:  [140 lb (63.5 kg)] 140 lb (63.5 kg) (05/17 1550)  General - Well nourished, well developed, in no apparent distress.  Ophthalmologic - Sharp disc margins OU.   Cardiovascular - Regular rate and rhythm.  Mental Status -  Level of arousal and orientation to time, place, and person were intact. Language including expression, naming, repetition, comprehension was assessed and found intact. Fund of Knowledge was assessed and was intact.  Cranial Nerves II - XII - II - right homonymous hemianopia. III, IV, VI - Extraocular movements intact. V - Facial sensation intact bilaterally. VII - Facial movement intact  bilaterally. VIII - Hearing & vestibular intact bilaterally. X - Palate elevates symmetrically. XI - Chin turning & shoulder shrug intact bilaterally. XII - Tongue protrusion intact.  Motor Strength - The patient's strength was normal in all extremities and pronator drift was absent.  Bulk was normal and fasciculations were absent.   Motor Tone - Muscle tone was assessed at the neck and appendages and was normal.  Reflexes - The patient's reflexes were 1+ in all extremities and she had no pathological reflexes.  Sensory - Light touch, temperature/pinprick were assessed and were symmetrical.    Coordination - The patient had normal movements in the hands with no ataxia or dysmetria.  Tremor was absent.  Gait and Station - deferred   ASSESSMENT/PLAN Alexis Bray is a 61 y.o. female with history of TIA, DB and HTN presenting with new R visual field defect. She did not receive IV t-PA due to unknown LKW.   Stroke:   Subacute L PCA scattered infarcts progressed from the MRI in 09/2016. Infarcts secondary to large vessel atherosclerosis. The progression could be due to failed penumbra  over time or due to hypotension/hypoperfusion, however, pt has no neuro changes during the interval time. cardioembolic less likely, but can not be completely ruled out.   Resultant  R visual field defect  CT head no acute process. Subacute/chronic L PCA territory scattered infarcts  MRI head left PCA territory scattered infarct, progressed from 09/2016.   MRA head left PCA proximal occlusion, multivessel athero including right PCA, b/l carotid siphon, b/l M2 and right A2  Carotid Doppler  unremarkable  2D Echo  EF 60-65%  Recommend 30 day cardiac event monitoring as outpt to rule out afib.  LDL 101  HgbA1c 11.9  UDS negative  Lovenox 40 mg sq daily for VTE prophylaxis  Diet heart healthy/carb modified Room service appropriate? Yes; Fluid consistency: Thin  aspirin 81 mg daily and clopidogrel  75 mg daily prior to admission, now on aspirin 81 mg daily and clopidogrel 75 mg daily. Continue DAPT for 3 months and then plavix alone.   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 She needs to follow up with ophthalmology closely for visual field monitoring and clearance if improved. Pt expressed understanding  May need driving evaluation in the future.  OT to help visual field deficit.  Hx stroke/TIA  09/2016 - PCA scattered infarct - CT 4/24 no acute abnormality, ? Small old R PLIC infarct. MRI 4/28 w/ mixed L PCA infarcts. Occipital, posteromedial temporal and temporal occipital jxn subacute. - put on DAPT and lipitor  Hypertension  Stable   On Exforge (amlodipine/valsartain 5-320) daily PTA  BP goal normotensive   Hyperlipidemia  Home meds:  lipitor 80, resumed in hospital  LDL 101, goal < 70  Continue statin at discharge  Diabetes  HgbA1c 11.9, goal < 7.0  Uncontrolled  Other Stroke Risk Factors  ETOH use, advised to drink no more than 1 drink(s) a day  Pt interested in [facility] trial  Hospital day # 0  Neurology will sign off. Please call with questions. Pt will follow up with stroke clinic at [facility] in about 6 weeks. Thanks for the consult.  Marvel Plan, MD PhD Stroke Neurology 10/22/2016 3:15 PM   To contact Stroke Continuity provider, please refer to [email or web address]. After hours, contact General Neurology

## 2016-10-22 NOTE — Telephone Encounter (Signed)
Received call on (10/21/16) from the pt's daughter stating that the pt called her stating that she is having blurred vision and she is having a hard time seeing this afternoon.  The daughter wanted to know if she should take the pt to ED or what should she do.   Informed Ramon Dredgedward of the message and he stated that the pt should be taking to the ED.  They may need to run some test and maybe do a scan.  The daughter verbalized understanding and agreed.//AB/CMA

## 2016-10-22 NOTE — Evaluation (Signed)
Speech Language Pathology Evaluation Patient Details Name: Adamariz Gillott MRN: [id number] DOB: 03-Jul-1955 Today's Date: 10/22/2016 Time: 4782-9562 SLP Time Calculation (min) (ACUTE ONLY): 30 min  Problem List:  Patient Active Problem List   Diagnosis Date Noted  . Arterial ischemic stroke, PCA (posterior cerebral artery), left, acute (HCC) 10/21/2016  . Essential hypertension 10/21/2016  . Controlled type 2 diabetes mellitus without complication, without long-term current use of insulin (HCC) 10/21/2016  . Microcytic anemia 10/21/2016   Past Medical History:  Past Medical History:  Diagnosis Date  . Diabetes mellitus without complication (HCC)   . History of chicken pox   . Hypertension   . Prediabetes   . TIA (transient ischemic attack)    09/2016, 10/2016   Past Surgical History:  Past Surgical History:  Procedure Laterality Date  . Ankles surgery Right 2005  . CESAREAN SECTION    . EYE SURGERY     HPI:  61 yo female adm to [facility] with vision difficulties.  The patient came to the ER three weeks ago with 3 weeks "confusion" and repeating herself per review of notes.  CT head at that time was unremarkable and her neuro exam was negative.  She saw a new PCP four days later, who ordered an MR brain for memory loss, and this showed "subacute infarction in left occipital lobe, subacute infarction in other areas of the left PCA territory" per MD notes.  CT head 5/17 showed Subacute to chronic LEFT PCA/posterior with watershed territory infarcts".  MRI done today and results pending.  Pt works at [employer] and lives alone.    Assessment / Plan / Recommendation Clinical Impression  MOCA 7.2 administered to pt with pt scoring 17/30 indicative of moderate cognitive deficits - most notably in areas of memory and orientation.  Pt required category cues to recall  2/5 words, multiple choice for 2/5 words and did not identify one word from choice of 3.  She was noted to frequently repeat  information during evaluation - she also repeated the same words during fluency task.   Pt admits to mild word finding difficulties - stating "I know it, I just can't get it out".  Vision deficits reported by pt with her stating middle of vision field is "blurry".  Given pt lives alone, recommend follow up SlP to maximize funcitonal cognitive linguistic rehabiliation.   Of note, pt with poor awareness to documented concern for memory deficits prior to admission - she denies this information.  Pt agreeable to plan and specific goals established.      SLP Assessment  SLP Recommendation/Assessment: Patient needs continued Speech Lanaguage Pathology Services SLP Visit Diagnosis: Cognitive communication deficit (R41.841)    Follow Up Recommendations  24 hour supervision/assistance;Home health SLP;Outpatient SLP    Frequency and Duration min 2x/week  2 weeks      SLP Evaluation Cognition  Overall Cognitive Status: No family/caregiver present to determine baseline cognitive functioning Arousal/Alertness: Awake/alert Orientation Level: Oriented to person;Oriented to place;Oriented to situation;Disoriented to time;Other (comment) (pt states we are in "[employer]") Attention: Sustained Sustained Attention: Appears intact Memory: Impaired Memory Impairment: Storage deficit;Retrieval deficit Problem Solving: Appears intact Safety/Judgment: Other (comment) (pt asking if she can live alone)       Comprehension  Auditory Comprehension Overall Auditory Comprehension: Appears within functional limits for tasks assessed Yes/No Questions: Not tested Commands: Within Functional Limits Conversation: Simple Interfering Components: Attention;Processing speed Visual Recognition/Discrimination Discrimination: Within Function Limits Reading Comprehension Reading Status: Not tested  Expression Expression Primary Mode of Expression: Verbal Verbal Expression Initiation: No impairment Repetition: No  impairment Naming: No impairment Pragmatics: No impairment Written Expression Dominant Hand: Right   Oral / Motor  Oral Motor/Sensory Function Overall Oral Motor/Sensory Function: Within functional limits Motor Speech Overall Motor Speech: Appears within functional limits for tasks assessed Phonation: Normal Resonance: Within functional limits Articulation: Within functional limitis Intelligibility: Intelligible Motor Planning: Witnin functional limits   GO          Functional Assessment Tool Used: MOCA 7.2, cognitive assessment Functional Limitations: Memory Memory Current Status (A5409(G9168): At least 80 percent but less than 100 percent impaired, limited or restricted Memory Goal Status (W1191(G9169): At least 60 percent but less than 80 percent impaired, limited or restricted         Donavan Burnetamara Denesha Brouse, MS [facility] SLP [phone]

## 2016-10-22 NOTE — Telephone Encounter (Signed)
Called on (10/21/16) @ 7:50am and spoke with the pt's daughter to make sure she received my message on Wed evening.  She stated that she did and she was getting ready to give me a call back.  She stated that the pt said she felt better, she is eating better, and her BS was 123 on yesterday.  The pt's BP on yesterday was 150/88.   She said the pt felt like she did not need to come in. So she asked for the appt to be canceled. Informed the daughter to make sure that the pt is eating and drinking so she does not get dehydrated.  And to also make should she is keeping a check on her BS and BP.  She verbalized understanding and agreed.//AB/CMA

## 2016-10-22 NOTE — Progress Notes (Signed)
  Echocardiogram 2D Echocardiogram has been performed.  Alexis Bray, Alexis Bray 10/22/2016, 12:25 PM

## 2016-10-22 NOTE — Progress Notes (Signed)
[facility] PROGRESS NOTE  Alexis Bray ZOX:[id number] DOB: 22-Jun-1955 DOA: 10/21/2016  PCP: Alexis DoryWendling, Nicholas Paul, DO  Brief History/Interval Summary: 61 year old female with a past medical history of hypertension and non-insulin-dependent diabetes, presented with vision loss in the peripheral vision on the right side. She had confusion about 3 weeks ago and presented to the emergency department. CT did not show any abnormalities. Her neurological exam was benign. She saw her primary care physician subsequently underwent MRI on 4/28 which showed left PCA territory stroke.  Reason for Visit: Acute stroke  Consultants: Neurology  Procedures:  Transthoracic echocardiogram is pending  Carotid Doppler Bilateral - No evidence of significant ICA stenosis. Vertebral artery flow is antegrade  Antibiotics: None  Subjective/Interval History: Patient continues to have impaired vision in her right visual field. Denies any headaches. No weakness in the upper or lower extremities.  ROS: Denies any nausea or vomiting  Objective:  Vital Signs  Vitals:   10/22/16 0400 10/22/16 0600 10/22/16 0800 10/22/16 0939  BP: (!) 143/76 (!) 145/79 (!) 146/82 (!) 150/82  Pulse: 77 70 79 82  Resp: 18 18    Temp: 98.1 F (36.7 C) 98.1 F (36.7 C) 98.4 F (36.9 C) 98.2 F (36.8 C)  TempSrc: Oral Oral Oral Oral  SpO2: 96% 99% 99% 96%  Weight:      Height:        Intake/Output Summary (Last 24 hours) at 10/22/16 1239 Last data filed at 10/22/16 1100  Gross per 24 hour  Intake              360 ml  Output                0 ml  Net              360 ml   Filed Weights   10/21/16 1550  Weight: 63.5 kg (140 lb)    General appearance: alert, cooperative, appears stated age and no distress Resp: clear to auscultation bilaterally Cardio: regular rate and rhythm, S1, S2 normal, no murmur, click, rub or gallop GI: soft, non-tender; bowel sounds normal; no masses,  no  organomegaly Extremities: extremities normal, atraumatic, no cyanosis or edema Neurologic: Impaired in the right peripheral vision on field testing. No other focal neurological deficits noted.  Lab Results:  Data Reviewed: I have personally reviewed following labs and imaging studies  CBC:  Recent Labs Lab 10/21/16 1647 10/22/16 0716  WBC 9.1 6.9  NEUTROABS 7.0  --   HGB 11.6* 11.0*  HCT 34.8* 34.7*  MCV 71.5* 72.1*  PLT 363 316    Basic Metabolic Panel:  Recent Labs Lab 10/21/16 1647 10/22/16 0716  NA 136 135  K 3.6 3.7  CL 101 102  CO2 23 25  GLUCOSE 163* 192*  BUN 20 16  CREATININE 0.63 0.60  CALCIUM 9.4 9.2    GFR: Estimated Creatinine Clearance: 63.8 mL/min (by C-G formula based on SCr of 0.6 mg/dL).  Liver Function Tests:  Recent Labs Lab 10/21/16 1647 10/22/16 0716  AST 22 20  ALT 14 15  ALKPHOS 70 65  BILITOT 0.4 0.3  PROT 7.9 7.5  ALBUMIN 3.9 3.8    Coagulation Profile:  Recent Labs Lab 10/21/16 1647  INR 1.04    Cardiac Enzymes:  Recent Labs Lab 10/21/16 1647  TROPONINI <0.03   CBG:  Recent Labs Lab 10/21/16 2212 10/22/16 0729  GLUCAP 156* 169*    Thyroid Function Tests:  Recent Labs  10/22/16 0716  TSH 3.978    Anemia Panel:  Recent Labs  10/22/16 0716  VITAMINB12 899     Radiology Studies: Ct Head Wo Contrast  Result Date: 10/21/2016 CLINICAL DATA:  RIGHT vision changes and dizziness today. History of stroke, hypertension, diabetes and 9 stroke 3. EXAM: CT HEAD WITHOUT CONTRAST TECHNIQUE: Contiguous axial images were obtained from the base of the skull through the vertex without intravenous contrast. COMPARISON:  MRI of the head October 02, 2016 FINDINGS: BRAIN: No intraparenchymal hemorrhage, mass effect nor midline shift. The ventricles and sulci are normal for age. Patchy supratentorial white matter hypodensities within normal range for patient's age, though non-specific are most compatible with chronic  small vessel ischemic disease. Subacute to chronic LEFT parietal occipital lobe infarcts with linear density in LEFT mesial parietal occipital lobe cortex consistent with laminar necrosis and mineralization. No acute large vascular territory infarcts. No abnormal extra-axial fluid collections. Basal cisterns are patent. VASCULAR: Mild calcific atherosclerosis of the carotid siphons. SKULL: No skull fracture. No significant scalp soft tissue swelling. SINUSES/ORBITS: Severe RIGHT paranasal sinusitis.RIGHT mastoid effusion. The included ocular globes and orbital contents are non-suspicious. OTHER: None. IMPRESSION: No acute intracranial process. Subacute to chronic LEFT PCA/posterior with watershed territory infarcts. Electronically Signed   By: Awilda Metro M.D.   On: 10/21/2016 17:31     Medications:  Scheduled: . amLODipine  5 mg Oral Daily   And  . irbesartan  300 mg Oral Daily  . aspirin EC  81 mg Oral Daily  . atorvastatin  80 mg Oral QHS  . clopidogrel  75 mg Oral Daily  . enoxaparin (LOVENOX) injection  40 mg Subcutaneous Q24H  . insulin aspart  0-15 Units Subcutaneous TID WC  . insulin aspart  0-5 Units Subcutaneous QHS   Continuous:  [id number] **OR** acetaminophen (TYLENOL) oral liquid 160 mg/5 mL **OR** acetaminophen, senna-docusate  Assessment/Plan:  Principal Problem:   Arterial ischemic stroke, PCA (posterior cerebral artery), left, acute (HCC) Active Problems:   Essential hypertension   Controlled type 2 diabetes mellitus without complication, without long-term current use of insulin (HCC)   Microcytic anemia    Acute stroke involving the left PCA territory Stroke was initially diagnosed about 3 weeks ago on MRI. Unclear if she's had new event or not. Repeat MRI report is pending. Patient was on aspirin and Plavix prior to arrival. Echocardiogram report is pending. Carotid Dopplers without any significant stenosis. LDL was 101 on April 26. Patient is on a  statin. Discussed with neurology. Continue current antiplatelet agents. They recommended a 30 day event monitor. PT and OT evaluation.  Memory loss Could be related to stroke. B-12 level is normal. TSH is normal. RPR is nonreactive.  Essential hypertension Blood pressure stable. Continue home medications.  Hyperlipidemia Continue statin.  Non-insulin-dependent diabetes mellitus. HbA1c was 11.9 in April. It appears that this was recently diagnosed and patient was recently placed on metformin. Continue just oral agents for now at discharge. She will need close follow-up with her primary care physician.  Abnormal EKG Patient noted to have sinus rhythm with diffuse ST changes which has been seen previously as well. No chest pain. Await echocardiogram.  DVT Prophylaxis: Lovenox    Code Status: Full code  Family Communication: Discussed with the patient. No family at bedside  Disposition Plan: Await PT, OT evaluation. Await MRI report. Await echo report.    LOS: 0 days   [facility]  [facility] Pager [phone] 10/22/2016, 12:39 PM  If 7PM-7AM, please contact night-coverage at  [email or web address], password [facility]

## 2016-10-22 NOTE — Evaluation (Signed)
Occupational Therapy Evaluation Patient Details Name: Alexis Bray MRN: [id number] DOB: 08-Sep-1955 Today's Date: 10/22/2016    History of Present Illness pt is a 61 y/o female with pmh significant for HTN and NIDDM who presents with worsening R visual field loss, dizziness and unsteadiness.  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  MRI this admission shows extension of the infarcts for recent MRI.   Clinical Impression   PTA Pt independent in ADL/IADL and mobility. Pt driving, working at [employer] and watches 2 y/o grandson. Pt is currently supervision for ADL and mobility, but displaying increase in right visual field deficits. Pt struggled with problem solving and awareness of deficits. Please see OT problem list below. Pt tearful during session "I've always been so independent, I'm usually the one helping others" Pt will benefit from skilled OT in the acute setting to continue to evaluate visual deficits and for education in compensatory strategies to maximize safety and independence. Pt will require NEURO outpatient OT to continue education and assist in return to PLOF.     Follow Up Recommendations  Outpatient OT;Supervision/Assistance - 24 hour (NEURO outpatient)    Equipment Recommendations  None recommended by OT    Recommendations for Other Services       Precautions / Restrictions Precautions Precautions: Fall Restrictions Weight Bearing Restrictions: No      Mobility Bed Mobility Overal bed mobility: Independent             General bed mobility comments: able to get in bed sit to supine with no assist  Transfers Overall transfer level: Needs assistance   Transfers: Sit to/from Stand Sit to Stand: Supervision         General transfer comment: safety only    Balance Overall balance assessment: Needs  assistance Sitting-balance support: No upper extremity supported;Feet supported Sitting balance-Leahy Scale: Normal     Standing balance support: No upper extremity supported;During functional activity Standing balance-Leahy Scale: Good                             ADL either performed or assessed with clinical judgement   ADL Overall ADL's : Needs assistance/impaired Eating/Feeding: Modified independent   Grooming: Minimal assistance;Standing Grooming Details (indicate cue type and reason): cueing for visual scanning to find grooming items Upper Body Bathing: Supervision/ safety   Lower Body Bathing: Supervison/ safety   Upper Body Dressing : Supervision/safety   Lower Body Dressing: Supervision/safety   Toilet Transfer: Supervision/safety;Ambulation;Comfort height toilet;Grab bars Toilet Transfer Details (indicate cue type and reason): in room Toileting- Clothing Manipulation and Hygiene: Supervision/safety;Sit to/from stand   Tub/ Shower Transfer: Tub transfer;Min guard;Ambulation Tub/Shower Transfer Details (indicate cue type and reason): simulated in room Functional mobility during ADLs: Min guard General ADL Comments: deficits in ADL drive from right visual field deficit which has INCREASED, and is impairing her more. Pt also unaware of difficulties during session. Pt very tearful at one point.     Vision Baseline Vision/History: Wears glasses Wears Glasses: At all times Patient Visual Report: Other (comment) (righ visual field deficit (right hemianopia)) Vision Assessment?: Yes Eye Alignment: Within Functional Limits Ocular Range of Motion: Within Functional Limits Alignment/Gaze Preference: Within Defined Limits Visual Fields: Right homonymous hemianopsia;Impaired-to be further tested in functional context;Left visual field deficit (Pt unable to read hospital signs correctly)  Additional Comments: Pt able to demonstrate good visual scanning finding 8/8 B's  in a field of 100 letters. Need to investigate further with more assessment     Perception     Praxis      Pertinent Vitals/Pain Pain Assessment: No/denies pain     Hand Dominance Right   Extremity/Trunk Assessment Upper Extremity Assessment Upper Extremity Assessment: Overall WFL for tasks assessed   Lower Extremity Assessment Lower Extremity Assessment: Defer to PT evaluation   Cervical / Trunk Assessment Cervical / Trunk Assessment: Normal   Communication Communication Communication: No difficulties   Cognition Arousal/Alertness: Awake/alert Behavior During Therapy: WFL for tasks assessed/performed Overall Cognitive Status: No family/caregiver present to determine baseline cognitive functioning                                 General Comments: Does not show awareness of missing information on the right.  Appears to be missing information in the left field also. Pt was given the instructions to find the elevators, and told where sinage was in hospital. Pt was unable to problem solve and find elevators without max verbal cues.    General Comments       Exercises     Shoulder Instructions      Home Living Family/patient expects to be discharged to:: Private residence Living Arrangements: Alone Available Help at Discharge: Family;Available PRN/intermittently Type of Home: Apartment Home Access: Stairs to enter Entrance Stairs-Number of Steps: 3 flights Entrance Stairs-Rails: Right;Left Home Layout: One level;Other (Comment) (on 3rd floor)     Bathroom Shower/Tub: Chief Strategy Officer: Standard     Home Equipment: None      Lives With: Alone    Prior Functioning/Environment Level of Independence: Independent        Comments: Was independent and working at [employer] as Conservation officer, 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), also assists in caring for her 56 year old grandson        OT Problem List: Impaired  vision/perception;Decreased cognition;Decreased safety awareness      OT Treatment/Interventions: Therapeutic exercise;Therapeutic activities;Cognitive remediation/compensation;Visual/perceptual remediation/compensation;Patient/family education    OT Goals(Current goals can be found in the care plan section) Acute Rehab OT Goals Patient Stated Goal: live by myself OT Goal Formulation: With patient Time For Goal Achievement: 11/04/16 Potential to Achieve Goals: Good ADL Goals Pt Will Perform Grooming: with modified independence;standing (finding grooming items on right side of body with <1 vc) Additional ADL Goal #1: Pt will navigate back to room using all resources available at supervision level Additional ADL Goal #2: Pt will verbalize at least 3 compensatory strategies to decrease impact of right visual deficits with one or less verbal cues  OT Frequency: Min 3X/week   Barriers to D/C: Decreased caregiver support  Pt lives alone - states she can live with daughter       Co-evaluation              AM-PAC PT "6 Clicks" Daily Activity     Outcome Measure Help from another person eating meals?: None Help from another person taking care of personal grooming?: A Little Help from another person toileting, which includes using toliet, bedpan, or urinal?: None Help from another person bathing (including washing, rinsing, drying)?: None Help from another person to put on and taking off regular upper body clothing?: None Help from another person to put on and taking off regular lower body clothing?:  None 6 Click Score: 23   End of Session Equipment Utilized During Treatment: Gait belt Nurse Communication: Mobility status  Activity Tolerance: Patient tolerated treatment well Patient left: in bed;with call bell/phone within reach  OT Visit Diagnosis: Other symptoms and signs involving the nervous system (R29.898);Other symptoms and signs involving cognitive function                 Time: 1520-1543 OT Time Calculation (min): 23 min Charges:  OT General Charges $OT Visit: 1 Procedure OT Evaluation $OT Eval Moderate Complexity: 1 Procedure OT Treatments $Self Care/Home Management : 8-22 mins G-Codes:     Sherryl MangesLaura Elonzo Sopp OTR/L [phone]  Evern BioLaura J Evalyne Cortopassi 10/22/2016, 5:56 PM

## 2016-10-22 NOTE — Progress Notes (Signed)
VASCULAR LAB PRELIMINARY  PRELIMINARY  PRELIMINARY  PRELIMINARY  Carotid duplex completed.    Preliminary report:  Bilateral - No evidence of significant ICA stenosis. Vertebral artery flow is antegrade.  Maurine Mowbray, RVS 10/22/2016, 12:00 PM

## 2016-10-23 DIAGNOSIS — I63532 Cerebral infarction due to unspecified occlusion or stenosis of left posterior cerebral artery: Principal | ICD-10-CM

## 2016-10-23 LAB — BASIC METABOLIC PANEL
Anion gap: 10 (ref 5–15)
BUN: 15 mg/dL (ref 6–20)
CHLORIDE: 103 mmol/L (ref 101–111)
CO2: 23 mmol/L (ref 22–32)
CREATININE: 0.56 mg/dL (ref 0.44–1.00)
Calcium: 9.4 mg/dL (ref 8.9–10.3)
Glucose, Bld: 189 mg/dL — ABNORMAL HIGH (ref 65–99)
Potassium: 3.8 mmol/L (ref 3.5–5.1)
SODIUM: 136 mmol/L (ref 135–145)

## 2016-10-23 LAB — RETICULOCYTES
RBC.: 4.9 MIL/uL (ref 3.87–5.11)
RETIC COUNT ABSOLUTE: 29.4 10*3/uL (ref 19.0–186.0)
Retic Ct Pct: 0.6 % (ref 0.4–3.1)

## 2016-10-23 LAB — CBC
HCT: 35.5 % — ABNORMAL LOW (ref 36.0–46.0)
HEMOGLOBIN: 11.1 g/dL — AB (ref 12.0–15.0)
MCH: 22.7 pg — AB (ref 26.0–34.0)
MCHC: 31.3 g/dL (ref 30.0–36.0)
MCV: 72.4 fL — ABNORMAL LOW (ref 78.0–100.0)
PLATELETS: 305 10*3/uL (ref 150–400)
RBC: 4.9 MIL/uL (ref 3.87–5.11)
RDW: 16.1 % — AB (ref 11.5–15.5)
WBC: 6 10*3/uL (ref 4.0–10.5)

## 2016-10-23 LAB — IRON AND TIBC
Iron: 33 ug/dL (ref 28–170)
SATURATION RATIOS: 8 % — AB (ref 10.4–31.8)
TIBC: 420 ug/dL (ref 250–450)
UIBC: 387 ug/dL

## 2016-10-23 LAB — GLUCOSE, CAPILLARY
GLUCOSE-CAPILLARY: 204 mg/dL — AB (ref 65–99)
Glucose-Capillary: 195 mg/dL — ABNORMAL HIGH (ref 65–99)

## 2016-10-23 LAB — HEMOGLOBIN A1C
Hgb A1c MFr Bld: 11.5 % — ABNORMAL HIGH (ref 4.8–5.6)
Mean Plasma Glucose: 283 mg/dL

## 2016-10-23 LAB — FOLATE: FOLATE: 16.8 ng/mL (ref >5.9)

## 2016-10-23 LAB — FERRITIN: FERRITIN: 11 ng/mL (ref 11–307)

## 2016-10-23 MED ORDER — ATORVASTATIN CALCIUM 80 MG PO TABS: 80.0000 mg | ORAL_TABLET | Freq: Every day | ORAL | 0 refills | Status: DC

## 2016-10-23 NOTE — Progress Notes (Signed)
Occupational Therapy Treatment Patient Details Name: Alexis Bray MRN: [id number] DOB: [birth year] Today's Date: 10/23/2016    History of present illness pt is a 61 y/o female with pmh significant for HTN and NIDDM who presents with worsening R visual field loss, dizziness and unsteadiness.  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  MRI this admission shows extension of the infarcts for recent MRI.   OT comments  Pt making progress towards OT goals this session. Session focused on visual scanning strategies and problem solving for right visual field deficits. Please see performance level below. Pt continues to require OPOT (neuro) upon discharge to maximize safety and independence and continue education and compensatory strategies.  Follow Up Recommendations  Outpatient OT;Supervision/Assistance - 24 hour (NEURO)    Equipment Recommendations  None recommended by OT    Recommendations for Other Services      Precautions / Restrictions Precautions Precautions: Fall Precaution Comments: R visual field deficits Restrictions Weight Bearing Restrictions: No       Mobility Bed Mobility Overal bed mobility: Independent             General bed mobility comments: able to get in and out of bed sit <> supine with no assist  Transfers Overall transfer level: Needs assistance   Transfers: Sit to/from Stand Sit to Stand: Supervision         General transfer comment: safety only    Balance Overall balance assessment: Needs assistance Sitting-balance support: No upper extremity supported;Feet supported Sitting balance-Leahy Scale: Normal     Standing balance support: No upper extremity supported;During functional activity Standing balance-Leahy Scale: Good                             ADL either performed or assessed with clinical  judgement   ADL Overall ADL's : Needs assistance/impaired     Grooming: Min guard;Standing Grooming Details (indicate cue type and reason): able to find all grooming items when placed aroud the sink, unable to problem solve how to open toothbrush                             Functional mobility during ADLs: Min guard       Vision   Visual Fields: Right homonymous hemianopsia;Impaired-to be further tested in functional context;Left visual field deficit Additional Comments: During session, Pt perseverating that "I have the wrong glasses, I've got to go to the Doctor and get new ones" Pt unable to read maps, or signs, but could read each letter by letter to make words   Perception     Praxis      Cognition Arousal/Alertness: Awake/alert Behavior During Therapy: WFL for tasks assessed/performed Overall Cognitive Status: No family/caregiver present to determine baseline cognitive functioning                                 General Comments: decreased awareness of deficits, decreased short term memory, decreased orientation, decreased problem solving        Exercises     Shoulder Instructions       General Comments Pt verbalized and agreed that she is not safe to drive    Pertinent Vitals/ Pain  Pain Assessment: No/denies pain  Home Living                                          Prior Functioning/Environment              Frequency  Min 3X/week        Progress Toward Goals  OT Goals(current goals can now be found in the care plan section)  Progress towards OT goals: Progressing toward goals  Acute Rehab OT Goals Patient Stated Goal: live by myself OT Goal Formulation: With patient Time For Goal Achievement: 11/04/16 Potential to Achieve Goals: Good  Plan Discharge plan remains appropriate    Co-evaluation                 AM-PAC PT "6 Clicks" Daily Activity     Outcome Measure   Help from  another person eating meals?: None Help from another person taking care of personal grooming?: A Little Help from another person toileting, which includes using toliet, bedpan, or urinal?: None Help from another person bathing (including washing, rinsing, drying)?: None Help from another person to put on and taking off regular upper body clothing?: None Help from another person to put on and taking off regular lower body clothing?: None 6 Click Score: 23    End of Session Equipment Utilized During Treatment: Gait belt  OT Visit Diagnosis: Other symptoms and signs involving the nervous system (R29.898);Other symptoms and signs involving cognitive function   Activity Tolerance Patient tolerated treatment well   Patient Left in bed;with call bell/phone within reach   Nurse Communication Mobility status        Time: 1610-9604 OT Time Calculation (min): 29 min  Charges: OT General Charges $OT Visit: 1 Procedure OT Treatments $Self Care/Home Management : 8-22 mins $Therapeutic Activity: 8-22 mins  Sherryl Manges OTR/L [phone]   Evern Bio Lelan Cush 10/23/2016, 2:16 PM

## 2016-10-23 NOTE — Progress Notes (Signed)
Patient and RN discussed discharge instructions with patient and patient's family. They verbalized understanding of discharge instructions of f/u appts, medications, understands when next dosage of medication is due, s/sx of stroke to report, tele d/c, IV d/c. Neuro assessment unchanged.

## 2016-10-23 NOTE — Discharge Summary (Signed)
[facility]  Physician Discharge Summary   Patient ID: Alexis Bray MRN: [id number] DOB/AGE: October 01, 1955 61 y.o.  Admit date: 10/21/2016 Discharge date: 10/23/2016  PCP: Alexis Dory, DO  DISCHARGE DIAGNOSES:  Principal Problem:   Arterial ischemic stroke, PCA (posterior cerebral artery), left, acute (HCC) Active Problems:   Essential hypertension   Controlled type 2 diabetes mellitus without complication, without long-term current use of insulin (HCC)   Microcytic anemia   Stroke (cerebrum) (HCC)   RECOMMENDATIONS FOR OUTPATIENT FOLLOW UP: 1. Referral sent for outpatient PT and OT 2. Message sent to cardiology office to arrange 30 day event monitor 3. Needs close follow-up with PCP for diabetes management 4. Needs to be evaluated by ophthalmologist for vision impairment   DISCHARGE CONDITION: fair  Diet recommendation: Modified carbohydrate  Filed Weights   10/21/16 1550  Weight: 63.5 kg (140 lb)    INITIAL HISTORY: 61 year old female with a past medical history of hypertension and non-insulin-dependent diabetes, presented with vision loss in the peripheral vision on the right side. She had confusion about 3 weeks ago and presented to the emergency department. CT did not show any abnormalities. Her neurological exam was benign. She saw her primary care physician subsequently underwent MRI on 4/28 which showed left PCA territory stroke.  Consultations:  Neurology  Procedures:  Transthoracic echocardiogram Study Conclusions  - Left ventricle: The cavity size was normal. Wall thickness was   increased in a pattern of moderate LVH. Systolic function was   normal. The estimated ejection fraction was in the range of 60%   to 65%. Wall motion was normal; there were no regional wall   motion abnormalities. Doppler parameters are consistent with   abnormal left ventricular relaxation (grade 1 diastolic   dysfunction). Doppler parameters are consistent  with high   ventricular filling pressure. - Mitral valve: Calcified annulus.  Impressions:  - Normal LV systolic function; mild diastolic dysfuncton with   elevated LV filling pressure; moderate LVH.  Carotid Doppler Bilateral - No evidence of significant ICA stenosis. Vertebral artery flow is antegrade  EEG Impression:  This is a mildly abnormal EEG due to the presence of intermixed focal slowing in the left temporal region. No epileptiform abnormalities or seizures.  HOSPITAL COURSE:   Acute stroke involving the left PCA territory Stroke was initially diagnosed about 3 weeks ago on MRI. MRI was repeated which showed diffuse acute and chronic infarcts in the left PCA territory. Patient was seen by neurology. She was on aspirin and Plavix prior to arrival. Plan is to continue current management. After 3 months she can change over to a Plavix alone. This can be addressed by neurology at follow-up. Carotid Dopplers did not show any significant stenosis. LDL was 101 on April 6. Continue atorvastatin. Patient does have right field visual defect. She has been told that she cannot drive until cleared by ophthalmology. She will need to see an ophthalmologist in the next 1-2 weeks.  30 day event monitor was recommended by neurology. Mrs. sent to the cardiologist's office to arrange this. Seen by PT and OT. Outpatient PT and OT is felt to be required. Referral has been sent.   Memory loss Could be related to stroke. B-12 level is normal. TSH is normal. RPR is nonreactive.  Essential hypertension Blood pressure stable. Continue home medications.  Hyperlipidemia Continue statin.  Non-insulin-dependent diabetes mellitus. HbA1c was 11.9 in April. It appears that this was recently diagnosed and patient was recently placed on metformin. Continue just oral agents  for now at discharge. She will need close follow-up with her primary care physician.  Abnormal EKG Patient noted to have sinus  rhythm with diffuse ST changes which has been seen previously as well. She denied chest pain. Echocardiogram showed normal systolic function. LVH was noted. EKG likely showing strain pattern due to LVH.  Overall, stable. Okay for discharge home today. Discussed in detail with patient and her daughter.    PERTINENT LABS:  The results of significant diagnostics from this hospitalization (including imaging, microbiology, ancillary and laboratory) are listed below for reference.      Labs: Basic Metabolic Panel:  Recent Labs Lab 10/21/16 1647 10/22/16 0716 10/23/16 0356  NA 136 135 136  K 3.6 3.7 3.8  CL 101 102 103  CO2 23 25 23   GLUCOSE 163* 192* 189*  BUN 20 16 15   CREATININE 0.63 0.60 0.56  CALCIUM 9.4 9.2 9.4   Liver Function Tests:  Recent Labs Lab 10/21/16 1647 10/22/16 0716  AST 22 20  ALT 14 15  ALKPHOS 70 65  BILITOT 0.4 0.3  PROT 7.9 7.5  ALBUMIN 3.9 3.8   CBC:  Recent Labs Lab 10/21/16 1647 10/22/16 0716 10/23/16 0356  WBC 9.1 6.9 6.0  NEUTROABS 7.0  --   --   HGB 11.6* 11.0* 11.1*  HCT 34.8* 34.7* 35.5*  MCV 71.5* 72.1* 72.4*  PLT 363 316 305   Cardiac Enzymes:  Recent Labs Lab 10/21/16 1647  TROPONINI <0.03   CBG:  Recent Labs Lab 10/22/16 1238 10/22/16 1555 10/22/16 2113 10/23/16 0604 10/23/16 1234  GLUCAP 185* 223* 222* 204* 195*     IMAGING STUDIES Ct Head Wo Contrast  Result Date: 10/21/2016 CLINICAL DATA:  RIGHT vision changes and dizziness today. History of stroke, hypertension, diabetes and 9 stroke 3. EXAM: CT HEAD WITHOUT CONTRAST TECHNIQUE: Contiguous axial images were obtained from the base of the skull through the vertex without intravenous contrast. COMPARISON:  MRI of the head October 02, 2016 FINDINGS: BRAIN: No intraparenchymal hemorrhage, mass effect nor midline shift. The ventricles and sulci are normal for age. Patchy supratentorial white matter hypodensities within normal range for patient's age, though  non-specific are most compatible with chronic small vessel ischemic disease. Subacute to chronic LEFT parietal occipital lobe infarcts with linear density in LEFT mesial parietal occipital lobe cortex consistent with laminar necrosis and mineralization. No acute large vascular territory infarcts. No abnormal extra-axial fluid collections. Basal cisterns are patent. VASCULAR: Mild calcific atherosclerosis of the carotid siphons. SKULL: No skull fracture. No significant scalp soft tissue swelling. SINUSES/ORBITS: Severe RIGHT paranasal sinusitis.RIGHT mastoid effusion. The included ocular globes and orbital contents are non-suspicious. OTHER: None. IMPRESSION: No acute intracranial process. Subacute to chronic LEFT PCA/posterior with watershed territory infarcts. Electronically Signed   By: Awilda Metro M.D.   On: 10/21/2016 17:31   Mr Brain Wo Contrast  Result Date: 10/22/2016 CLINICAL DATA:  Visual field loss EXAM: MRI HEAD WITHOUT CONTRAST MRA HEAD WITHOUT CONTRAST TECHNIQUE: Multiplanar, multiecho pulse sequences of the brain and surrounding structures were obtained without intravenous contrast. Angiographic images of the head were obtained using MRA technique without contrast. COMPARISON:  10/02/16 FINDINGS: MRI HEAD FINDINGS Brain: Patchy restricted diffusion throughout much of the left PCA territory, superimposed on chronic and subacute infarcts. The most confluent infarction is seen around the atrium of the left lateral ventricle/corpus callosum. Left thalamus involvement is mild. No malignant/significant hemorrhagic conversion. No hydrocephalus or mass. Vascular: Arterial findings below. Normal dural venous sinus flow voids. Skull  and upper cervical spine: Negative for marrow lesion Sinuses/Orbits: Obstructed right middle meatus with complete opacification of right frontal, anterior ethmoid, and maxillary sinuses. The medial wall of the right maxillary sinus is bowed. Near complete opacification of  the left sphenoid sinus. MRA HEAD FINDINGS Asymmetric ICA size, larger on the left in the setting of aplastic right A1 segment. There is atheromatous narrowing the right more than left carotid siphons, with moderate narrowing at the anterior genu on the right. Diffuse atherosclerotic irregularity of the right M1 and inferior division M2 branches, with moderate to advanced stenoses. Early branching left MCA with advanced stenosis of the inferior branch. Extensive atherosclerotic irregularity and narrowing of bilateral A2 and distal branches. No major branch occlusion noted. Negative for aneurysm. Mild left vertebral artery dominance. There is smooth mild to moderate narrowing of the distal basilar. Severe atheromatous irregularity of the bilateral PCAs. There is advanced stenosis of the right P1 and P2 segments. Proximal left P2 occlusion. The atheromatous superior cerebellar arteries show some contribution to the posterior cerebral circulation. IMPRESSION: 1. Diffuse acute on chronic infarct in the left PCA territory that has progressed from 10/02/2016. Left proximal P2 segment occlusion. 2. Advanced intracranial atherosclerosis with multiple proximal stenoses described above. 3. Postobstructive sinusitis at the level of the right middle meatus. Moderate left sphenoid sinusitis. Electronically Signed   By: Marnee Spring M.D.   On: 10/22/2016 08:08   Mr Maxine Glenn Headm  Result Date: 10/22/2016 CLINICAL DATA:  Visual field loss EXAM: MRI HEAD WITHOUT CONTRAST MRA HEAD WITHOUT CONTRAST TECHNIQUE: Multiplanar, multiecho pulse sequences of the brain and surrounding structures were obtained without intravenous contrast. Angiographic images of the head were obtained using MRA technique without contrast. COMPARISON:  10/02/16 FINDINGS: MRI HEAD FINDINGS Brain: Patchy restricted diffusion throughout much of the left PCA territory, superimposed on chronic and subacute infarcts. The most confluent infarction is seen around the  atrium of the left lateral ventricle/corpus callosum. Left thalamus involvement is mild. No malignant/significant hemorrhagic conversion. No hydrocephalus or mass. Vascular: Arterial findings below. Normal dural venous sinus flow voids. Skull and upper cervical spine: Negative for marrow lesion Sinuses/Orbits: Obstructed right middle meatus with complete opacification of right frontal, anterior ethmoid, and maxillary sinuses. The medial wall of the right maxillary sinus is bowed. Near complete opacification of the left sphenoid sinus. MRA HEAD FINDINGS Asymmetric ICA size, larger on the left in the setting of aplastic right A1 segment. There is atheromatous narrowing the right more than left carotid siphons, with moderate narrowing at the anterior genu on the right. Diffuse atherosclerotic irregularity of the right M1 and inferior division M2 branches, with moderate to advanced stenoses. Early branching left MCA with advanced stenosis of the inferior branch. Extensive atherosclerotic irregularity and narrowing of bilateral A2 and distal branches. No major branch occlusion noted. Negative for aneurysm. Mild left vertebral artery dominance. There is smooth mild to moderate narrowing of the distal basilar. Severe atheromatous irregularity of the bilateral PCAs. There is advanced stenosis of the right P1 and P2 segments. Proximal left P2 occlusion. The atheromatous superior cerebellar arteries show some contribution to the posterior cerebral circulation. IMPRESSION: 1. Diffuse acute on chronic infarct in the left PCA territory that has progressed from 10/02/2016. Left proximal P2 segment occlusion. 2. Advanced intracranial atherosclerosis with multiple proximal stenoses described above. 3. Postobstructive sinusitis at the level of the right middle meatus. Moderate left sphenoid sinusitis. Electronically Signed   By: Marnee Spring M.D.   On: 10/22/2016 08:08  DISCHARGE EXAMINATION: Vitals:   10/22/16 2041  10/23/16 0033 10/23/16 0507 10/23/16 1031  BP: 129/64 133/71 (!) 160/68 112/84  Pulse: 75 71 67 83  Resp: 18 20 20 16   Temp: 98.7 F (37.1 C) 98.5 F (36.9 C) 98.3 F (36.8 C) 99.2 F (37.3 C)  TempSrc: Oral Oral Oral Oral  SpO2: 99% 99% 98% 100%  Weight:      Height:       General appearance: alert, cooperative, appears stated age and no distress Resp: clear to auscultation bilaterally Cardio: regular rate and rhythm, S1, S2 normal, no murmur, click, rub or gallop GI: soft, non-tender; bowel sounds normal; no masses,  no organomegaly Extremities: extremities normal, atraumatic, no cyanosis or edema   DISPOSITION: Home with daughter  Discharge Instructions    Ambulatory referral to Neurology    Complete by:  As directed    Follow up with stroke clinic Darrol Angel(Carolyn Martin preferred, if not available, then consider [facility] or [facility] whoever is available) [facility] in about 6-8 weeks. Thanks.   Ambulatory referral to Occupational Therapy    Complete by:  As directed    For stroke   Ambulatory referral to Physical Therapy    Complete by:  As directed    For stroke   Call MD for:  extreme fatigue    Complete by:  As directed    Call MD for:  persistant dizziness or light-headedness    Complete by:  As directed    Call MD for:  persistant nausea and vomiting    Complete by:  As directed    Call MD for:  severe uncontrolled pain    Complete by:  As directed    Call MD for:  temperature >100.4    Complete by:  As directed    Diet - low sodium heart healthy    Complete by:  As directed    Diet Carb Modified    Complete by:  As directed    Discharge instructions    Complete by:  As directed    No driving until you have been cleared by ophthalmology. Outpatient Ref[facility]. Take your medications as prescribed. Referral sent to cardiology office for an event monitor. Please follow up with your primary care physician  within one week for diabetes management.   You were cared for by a hospitalist during your hospital stay. If you have any questions about your discharge medications or the care you received while you were in the hospital after you are discharged, you can call the unit and asked to speak with the hospitalist on call if the hospitalist that took care of you is not available. Once you are discharged, your primary care physician will handle any further medical issues. Please note that NO REFILLS for any discharge medications will be authorized once you are discharged, as it is imperative that you return to your primary care physician (or establish a relationship with a primary care physician if you do not have one) for your aftercare needs so that they can reassess your need for medications and monitor your lab values. If you do not have a primary care physician, you can call [phone] for a physician referral.   Increase activity slowly    Complete by:  As directed       ALLERGIES:  Allergies  Allergen Reactions  . Aspirin Nausea And Vomiting     Discharge Medication List as of 10/23/2016  12:30 PM    CONTINUE these medications which have CHANGED   Details  atorvastatin (LIPITOR) 80 MG tablet Take 1 tablet (80 mg total) by mouth daily., Starting Sat 10/23/2016, Normal      CONTINUE these medications which have NOT CHANGED   Details  amLODipine-valsartan (EXFORGE) 5-320 MG tablet Take 1 tablet by mouth daily., Starting Wed 10/06/2016, Normal    aspirin EC 81 MG tablet Take 81 mg by mouth daily., Historical Med    clopidogrel (PLAVIX) 75 MG tablet Take 1 tablet (75 mg total) by mouth daily., Starting Mon 10/04/2016, Normal    glucose blood (ONETOUCH VERIO) test strip Check blood sugar 2-3 times a week.  Dx:E11.65, Normal    metFORMIN (GLUCOPHAGE) 500 MG tablet Week 1: 1 tab daily Week 2: 1 tab twice daily Week 3: 2 tabs in AM, 1 in evening Week 4: 2 tabs twice daily, Normal    ONETOUCH DELICA  LANCETS FINE MISC Check blood sugar 2-3 times a week.  Dx:E11.65, Normal         Follow-up Information    Nilda Riggs, NP. Schedule an appointment as soon as possible for a visit in 6 week(s).   Specialty:  Family Medicine Why:  office will call you for appointment Contact information: [address] [place] [address] [phone]        Alexis Dory, DO. Schedule an appointment as soon as possible for a visit in 1 week(s).   Specialty:  Family Medicine Contact information: [address] [place] [address] [phone]           TOTAL DISCHARGE TIME: 35 mins  [facility]  [facility] Pager [phone]  10/23/2016, 4:48 PM

## 2016-10-25 ENCOUNTER — Other Ambulatory Visit: Payer: Self-pay | Admitting: Cardiology

## 2016-10-25 ENCOUNTER — Telehealth: Payer: Self-pay

## 2016-10-25 ENCOUNTER — Telehealth: Payer: Self-pay | Admitting: Family

## 2016-10-25 DIAGNOSIS — I639 Cerebral infarction, unspecified: Secondary | ICD-10-CM

## 2016-10-25 LAB — FOLATE RBC
FOLATE, HEMOLYSATE: 419.6 ng/mL
FOLATE, RBC: 1216 ng/mL (ref >498)
HEMATOCRIT: 34.5 % (ref 34.0–46.6)

## 2016-10-25 LAB — VAS US CAROTID
LCCADDIAS: -17 cm/s
LEFT ECA DIAS: -10 cm/s
LEFT VERTEBRAL DIAS: -12 cm/s
LICADSYS: -61 cm/s
Left CCA dist sys: -72 cm/s
Left CCA prox dias: 16 cm/s
Left CCA prox sys: 75 cm/s
Left ICA dist dias: -25 cm/s
Left ICA prox dias: -15 cm/s
Left ICA prox sys: -54 cm/s
RCCAPDIAS: 15 cm/s
RIGHT ECA DIAS: -14 cm/s
RIGHT VERTEBRAL DIAS: -8 cm/s
Right CCA prox sys: 82 cm/s

## 2016-10-25 NOTE — Telephone Encounter (Signed)
Transition Care Management Follow-up Telephone Call  ADMISSION DATE: 10/21/16   DISCHARGE DATE: 10/23/16   How have you been since you were released from the hospital? Patients daughter states patient has been ok other than vision changes. Will be seeing Opthalmologist when they make appointment per hospital discharge instructions.     Do you understand why you were in the hospital? YES   Do you understand the discharge instrcutions? Yes   Items Reviewed:  Medications reviewed:  Medications reviewed with daughter .  Allergies reviewed: Aspirin causes nausea per chart however patient has on medication list.  Dietary changes reviewed:Modified carboydrate  Referrals reviewed: Appointment scheduled for 10/29/16 with Verdene LennertM. Osullivan, NP. WIll schedule for Opthalmology, PT and OT and Cardiology.   Functional Questionnaire:   Activities of Daily Living (ADLs):  No help needed at this time per daughter.   Any transportation issues/concerns?: No   Any patient concerns? Per daughter patient will need arrangements made for someone to stay with patient during the day..   Confirmed importance and date/time of follow-up visits scheduled: Yes   Confirmed with patient if condition begins to worsen call PCP or go to the ER. Yes   Daughter was advised to call this office number after hours and on weekend for any problems or concerns about patient. Daughter agreed.

## 2016-10-25 NOTE — Telephone Encounter (Signed)
Pt is scheduled to see Melissa on (Fri-10/29/16 @ 2:30pm).//AB/CMA

## 2016-10-25 NOTE — Telephone Encounter (Signed)
Please contact patient to arrange a 1 week hospital follow up with Dr. Carmelia RollerWendling.

## 2016-10-29 ENCOUNTER — Ambulatory Visit (INDEPENDENT_AMBULATORY_CARE_PROVIDER_SITE_OTHER): Payer: 59 | Admitting: Family

## 2016-10-29 ENCOUNTER — Encounter: Payer: Self-pay | Admitting: Family

## 2016-10-29 ENCOUNTER — Other Ambulatory Visit: Payer: Self-pay | Admitting: Family Medicine

## 2016-10-29 VITALS — BP 136/70 | HR 98 | Temp 98.1°F | Resp 16 | Ht 64.0 in | Wt 132.6 lb

## 2016-10-29 DIAGNOSIS — I679 Cerebrovascular disease, unspecified: Secondary | ICD-10-CM

## 2016-10-29 DIAGNOSIS — E1165 Type 2 diabetes mellitus with hyperglycemia: Secondary | ICD-10-CM | POA: Diagnosis not present

## 2016-10-29 DIAGNOSIS — E119 Type 2 diabetes mellitus without complications: Secondary | ICD-10-CM

## 2016-10-29 DIAGNOSIS — H547 Unspecified visual loss: Secondary | ICD-10-CM

## 2016-10-29 DIAGNOSIS — Z8673 Personal history of transient ischemic attack (TIA), and cerebral infarction without residual deficits: Secondary | ICD-10-CM

## 2016-10-29 DIAGNOSIS — I639 Cerebral infarction, unspecified: Secondary | ICD-10-CM

## 2016-10-29 MED ORDER — SITAGLIPTIN PHOSPHATE 100 MG PO TABS: 50.0000 mg | ORAL_TABLET | Freq: Every day | ORAL | 0 refills | Status: DC

## 2016-10-29 MED ORDER — ATORVASTATIN CALCIUM 80 MG PO TABS: 80.0000 mg | ORAL_TABLET | Freq: Every day | ORAL | 3 refills | Status: AC

## 2016-10-29 MED ORDER — SERTRALINE HCL 50 MG PO TABS
ORAL_TABLET | ORAL | 0 refills | Status: DC
Start: 2016-10-29 — End: 2016-11-26

## 2016-10-29 MED ORDER — MULTI-VITAMIN/MINERALS PO TABS: 1.0000 | ORAL_TABLET | Freq: Every day | ORAL | Status: AC

## 2016-10-29 NOTE — Progress Notes (Signed)
Subjective:    Patient ID: Phillis HaggisKaren Rakestraw, female    DOB: [birth year], 61 y.o.   MRN: [id number]  HPI  Ms. Kennedy BuckerGrant is a 61 yr old female who presents today for hospital follow up following recent CVA. Hospital discharge summary is reviewed.  Pt was admitted on 10/21/16-10/23/16.  She initially presented with right sided peripheral vision loss. MRI prior to this admission performed on 4/18 noted L PCA territory stroke.  Hospital work up included neuro consultation, 2D echo (LVEF 60-65% and grade 1 diastolic dysfunction).  Carotid dopper neg for ICA stenosis, mildly abnorml EEG without epileptiform abnormalities or seizures. At time of her admission she was on plavix and aspirin.  Recommendation was that at 3 months she can change over to plavix alone. A 30 day event monitory was recommended. PT/OT evaluation recommended outpatient PT/OT.  She was continued on a statin.  She has an appointment on 11/11/16 for an event monitor and on 6/8 with neuro.      DM2-  Prior to hospitalization her sugar was 123 per daughter.  DM was recently diagnosed.  She notes GI upset since starting metformin. Lab Results  Component Value Date   HGBA1C 11.5 (H) 10/22/2016   HGBA1C 11.9 (H) 09/30/2016   Lab Results  Component Value Date   MICROALBUR 2.2 (H) 10/06/2016   LDLCALC 101 (H) 09/30/2016   CREATININE 0.56 10/23/2016   Depression-  Patient admits to feeling depressed since her most recent CVA. She is unable to drive and is now living with her daughter and her son-in-law.  Reports that she is a very independent person and really does not like needing to rely on others.  Daughter interjects that she feels that depression has been a longer standing problem than patient would like to admit to as the patient's apartment has become disorderly and the patient has exhibited some hoarding.  Patient reports that since she has experienced multiple episodes where others have betrayed or left her,  Holding on to tangible items  provides her with some discomfort.  Prior to her CVA she was also working.     Review of Systems See HPI  Past Medical History:  Diagnosis Date  . Diabetes mellitus without complication (HCC)   . History of chicken pox   . Hypertension   . Prediabetes   . TIA (transient ischemic attack)    09/2016, 10/2016     Social History   Social History  . Marital status: Single    Spouse name: N/A  . Number of children: N/A  . Years of education: N/A   Occupational History  . Not on file.   Social History Main Topics  . Smoking status: Never Smoker  . Smokeless tobacco: Never Used  . Alcohol use Yes     Comment: 1-2 per week  . Drug use: No  . Sexual activity: Not on file   Other Topics Concern  . Not on file   Social History Narrative  . No narrative on file    Past Surgical History:  Procedure Laterality Date  . Ankles surgery Right 2005  . CESAREAN SECTION    . EYE SURGERY      Family History  Problem Relation Age of Onset  . Adopted: Yes    Allergies  Allergen Reactions  . Aspirin Nausea And Vomiting    Current Outpatient Prescriptions on File Prior to Visit  Medication Sig Dispense Refill  . amLODipine-valsartan (EXFORGE) 5-320 MG tablet Take 1 tablet by mouth  daily. 30 tablet 2  . aspirin EC 81 MG tablet Take 81 mg by mouth daily.    . clopidogrel (PLAVIX) 75 MG tablet Take 1 tablet (75 mg total) by mouth daily. 30 tablet 3  . glucose blood (ONETOUCH VERIO) test strip Check blood sugar 2-3 times a week.  Dx:E11.65 100 each 12  . ONETOUCH DELICA LANCETS FINE MISC Check blood sugar 2-3 times a week.  Dx:E11.65 100 each 12   No current facility-administered medications on file prior to visit.     BP (!) 163/78 (BP Location: Right Arm, Cuff Size: Normal)   Pulse 98   Temp 98.1 F (36.7 C) (Oral)   Resp 16   Ht 5\' 4"  (1.626 m)   Wt 132 lb 9.6 oz (60.1 kg)   SpO2 100%   BMI 22.76 kg/m       Objective:   Physical Exam  Constitutional: She  appears well-developed and well-nourished.  Cardiovascular: Normal rate, regular rhythm and normal heart sounds.   No murmur heard. Pulmonary/Chest: Effort normal and breath sounds normal. No respiratory distress. She has no wheezes.  Skin: Skin is warm and dry.  Psychiatric: Her behavior is normal. Judgment and thought content normal.  Became tearful during exam           Assessment & Plan:  DM2- uncontrolled. Not tolerating metformin. D/c Metformin, begin [place] instead. We discussed diabetic diet at length today. Discussed glycemic goals and importance of getting her sugar controlled.   CVA-  We arranged a follow up appointment with opthalmology next week.  In the meantime, she is to continue asa and plavix,  Keep upcoming appointment with neuro and for event monitor placement and continue statin.    Depression- patient scored 18 on PHQ9. Denies SI/HI but does report hopelessness.  We discussed establishing with a counselor and also beginning zoloft 25mg  once daily for 1 week then increase to 50mg  once daily.  We discussed common side effects such as nausea, drowsiness and weight gain.  Also discussed rare but serious side effect of suicide ideation.  She is instructed to discontinue medication go directly to ED if this occurs.  Pt verbalizes understanding and is in agreement.  Plan follow up in 1 month to evaluate progress.    45 minutes spent with patient today. >50% of this time was spent counseling the patient on diabetes management, diabetic diet and depression.

## 2016-10-29 NOTE — Patient Instructions (Addendum)
You have an appointment with Dr. Tedd SiasErin Fontaine on 11/03/16 at 9 AM at [facility] [address], [place] [id number]  [phone] Please stop metformin and start Januvia. Keep your upcoming appointment with neurology and for your event monitor with cardiology. Begin sertraline 50mg  1/2 tab once daily for 1 week, then increase to a full tablet once daily on week two. Call 911 if you develop thoughts of hurting yourself or others.  Please Call [facility] to schedule an appointment with one of our counselors.  We have a counselor who comes to [place] if this is more convenient for you.  Their number is [phone].

## 2016-10-31 NOTE — Progress Notes (Signed)
Please start januvia 100mg  once daily and notify patient when we come back on Tuesday AM.

## 2016-11-02 ENCOUNTER — Encounter: Payer: Self-pay | Admitting: Family Medicine

## 2016-11-02 NOTE — Progress Notes (Unsigned)
Called pharmacy to see if Januvia received. States they did however it will need Prior Authorization. Called patient notified daughter.

## 2016-11-02 NOTE — Progress Notes (Signed)
Left message for pt to return my call.  Below comment copied from routing comment to PCP. See PCP response below.    I received a call from [facility] after hours requesting Rx for Januvia that Alexis Bray was supposed to start taking today. I reviewed note and plan was to stop Metformin and start Januvia but I did not see dose. I sent Rx for Januvia 100 mg to her pharmacy.  [facility] caller instructed pt to start 50 mg (1/2 tab) and to call Tuesday first thing in the morning to clarify dose.   Betty Swazil[place]Jordan, MD

## 2016-11-02 NOTE — Progress Notes (Signed)
[facility]. Called and requested fax be re-sent. Submitted PA request via covermymeds. Awaiting determination.

## 2016-11-02 NOTE — Progress Notes (Signed)
Spoke with pt's daughter, she states pt did not pick up Januvia over the weekend as insurance does not cover it and it is too expensive. Pt has been continuing to take Metformin until a suitable alternative can be found. She states pharmacy told her they were going to fax over request for potential substitute. Will obtain fax and let pt know outcome.

## 2016-11-03 LAB — HM DIABETES EYE EXAM

## 2016-11-03 MED ORDER — PIOGLITAZONE HCL 30 MG PO TABS: 30.0000 mg | ORAL_TABLET | Freq: Every day | ORAL | 2 refills | Status: AC

## 2016-11-03 NOTE — Progress Notes (Signed)
rx sent for actos instead.  Pt should start actos check sugar once daily for 1 week and then call Dr. Carmelia RollerWendling with her readings please.

## 2016-11-03 NOTE — Progress Notes (Signed)
Left message for pt to return my call.

## 2016-11-03 NOTE — Progress Notes (Signed)
[facility] with [facility] at [facility] at [phone] and was told that no medications in the same class as Januvia are covered. She did find that glyburide and glipizide are covered. She did not search for any other medication alternatives.  Please advise?

## 2016-11-03 NOTE — Progress Notes (Signed)
Notified pt's daughter of below recommendations and she voices understanding.

## 2016-11-05 ENCOUNTER — Ambulatory Visit (INDEPENDENT_AMBULATORY_CARE_PROVIDER_SITE_OTHER): Payer: 59 | Admitting: Family Medicine

## 2016-11-05 ENCOUNTER — Encounter: Payer: Self-pay | Admitting: Family Medicine

## 2016-11-05 ENCOUNTER — Telehealth: Payer: Self-pay | Admitting: *Deleted

## 2016-11-05 VITALS — BP 140/70 | HR 88 | Temp 98.7°F | Ht 64.0 in | Wt 129.6 lb

## 2016-11-05 DIAGNOSIS — E119 Type 2 diabetes mellitus without complications: Secondary | ICD-10-CM

## 2016-11-05 DIAGNOSIS — Z0279 Encounter for issue of other medical certificate: Secondary | ICD-10-CM

## 2016-11-05 DIAGNOSIS — I10 Essential (primary) hypertension: Secondary | ICD-10-CM

## 2016-11-05 MED ORDER — METOPROLOL SUCCINATE ER 50 MG PO TB24: 50.0000 mg | ORAL_TABLET | Freq: Every day | ORAL | 2 refills | Status: DC

## 2016-11-05 NOTE — Patient Instructions (Addendum)
Around 3 times per week, check your blood pressure 4 times per day. Twice in the morning and twice in the evening. The readings should be at least one minute apart. Write down these values and bring them to your next nurse visit/appointment.  When you check your BP, make sure you have been doing something calm/relaxing 5 minutes prior to checking. Both feet should be flat on the floor and you should be sitting. Use your left arm and make sure it is in a relaxed position (on a table), and that the cuff is at the approximate level/height of your heart.  Let us know if you are having issues with Actos.

## 2016-11-05 NOTE — Progress Notes (Signed)
Subjective:   Chief Complaint  Patient presents with  . Follow-up    1 mos    Phillis HaggisKaren Pursifull is a 61 y.o. female here for follow-up of diabetes.   Zilpha's self monitored glucose range is low to high 100's.  Patient denies hypoglycemic reactions. She checks her glucose levels 2 times per day. Patient does not require insulin.   Medications include: Actos 30 mg daily Failed Metformin due to GI side effects. Daughter is cutting pills in half due to size. She does take an aspirin daily. Statin? Yes ACEi/ARB? Yes  Hypertension Patient presents for hypertension follow up. She does monitor home blood pressures. Blood pressures ranging on average from 140-150's/80-90's. She is compliant with medications-Exforge 5-320 mg daily. Patient has these side effects of medication: none She is adhering to a healthy diet overall. Exercise: some walking   Past Medical History:  Diagnosis Date  . Diabetes mellitus without complication (HCC)   . History of chicken pox   . Hypertension   . Prediabetes   . TIA (transient ischemic attack)    09/2016, 10/2016    Past Surgical History:  Procedure Laterality Date  . Ankles surgery Right 2005  . CESAREAN SECTION    . EYE SURGERY      Social History   Social History  . Marital status: Single   Social History Main Topics  . Smoking status: Never Smoker  . Smokeless tobacco: Never Used  . Alcohol use Yes     Comment: 1-2 per week  . Drug use: No   Current Outpatient Prescriptions on File Prior to Visit  Medication Sig Dispense Refill  . amLODipine-valsartan (EXFORGE) 5-320 MG tablet Take 1 tablet by mouth daily. 30 tablet 2  . aspirin EC 81 MG tablet Take 81 mg by mouth daily.    Marland Kitchen. atorvastatin (LIPITOR) 80 MG tablet Take 1 tablet (80 mg total) by mouth daily. 30 tablet 3  . clopidogrel (PLAVIX) 75 MG tablet Take 1 tablet (75 mg total) by mouth daily. 30 tablet 3  . glucose blood (ONETOUCH VERIO) test strip Check blood sugar 2-3 times a  week.  Dx:E11.65 100 each 12  . Multiple Vitamins-Minerals (MULTIVITAMIN WITH MINERALS) tablet Take 1 tablet by mouth daily.    [name]. ONETOUCH DELICA LANCETS FINE MISC Check blood sugar 2-3 times a week.  Dx:E11.65 100 each 12  . pioglitazone (ACTOS) 30 MG tablet Take 1 tablet (30 mg total) by mouth daily. 30 tablet 2  . sertraline (ZOLOFT) 50 MG tablet 1/2 tablet by mouth once daily for 1 week, then increase to a full tablet once daily on week two 30 tablet 0    Related testing: Foot exam(monofilament and inspection):done Retinal exam:done Date of retinal exam: 10/2016, Dr Hazle Quantigby Pneumovax: Yes  Review of Systems: Pulmonary:  No SOB Cardiovascular:  No chest pain  Objective:  BP 140/70 (BP Location: Left Arm, Patient Position: Sitting, Cuff Size: Normal)   Pulse 88   Temp 98.7 F (37.1 C) (Oral)   Ht 5\' 4"  (1.626 m)   Wt 129 lb 9.6 oz (58.8 kg)   SpO2 97%   BMI 22.25 kg/m  General:  Well developed, well nourished, in no apparent distress Skin:  Warm, no pallor or diaphoresis Head:  Normocephalic, atraumatic Eyes:  Pupils equal and round, sclera anicteric without injection  Nose:  External nares without trauma, no discharge Throat/Pharynx:  Lips and gingiva without lesion Neck: Neck supple.  No obvious thyromegaly or masses.  No bruits Lungs:  clear to auscultation, breath sounds equal bilaterally, no wheezes, rales, or stridor Cardio:  regular rate and rhythm, no bruits, no LE edema Psych: Limited memory, nml affect and mood  Assessment:   Essential hypertension - Plan: metoprolol succinate (TOPROL-XL) 50 MG 24 hr tablet  Controlled type 2 diabetes mellitus without complication, without long-term current use of insulin (HCC)   Plan:   HTN- uncontrolled DM- uncontrolled Add Metoprolol. Keep Actos same for now, had just started 2 days ago. Tolerating well so far. Keep sugars and BP at home, write down. F/u in 1 mo for BP. The patient and daughter voiced understanding and  agreement to the plan.  Jilda [place], DO 11/05/16 11:09 AM

## 2016-11-05 NOTE — Telephone Encounter (Signed)
Received completed and signed FMLA paperwork from Dr. Carmelia RollerWendling.  Fax all forms to [employer] @ [phone]).  Confirmation received.  Original copy given to the pt's daughter.  Copy made to be scanned into the chart.//AB/CMA

## 2016-11-11 ENCOUNTER — Telehealth: Payer: Self-pay | Admitting: Family Medicine

## 2016-11-11 ENCOUNTER — Other Ambulatory Visit: Payer: Self-pay | Admitting: Cardiology

## 2016-11-11 ENCOUNTER — Ambulatory Visit (INDEPENDENT_AMBULATORY_CARE_PROVIDER_SITE_OTHER): Payer: 59

## 2016-11-11 DIAGNOSIS — I639 Cerebral infarction, unspecified: Secondary | ICD-10-CM | POA: Diagnosis not present

## 2016-11-11 DIAGNOSIS — I4891 Unspecified atrial fibrillation: Secondary | ICD-10-CM

## 2016-11-11 NOTE — Telephone Encounter (Signed)
Pt daughter says neurologist saw pt in hosp last month and pt was scheduled to fu with them. She wanted to know if pt should keep neuro appt tomorrow or go to one hosp recommended. Informed it is pt preference but dr wendling referred to neuro in our network that he would like her to see. Daughter decided to keep appt tomorrow with Alexis Bray.

## 2016-11-12 ENCOUNTER — Encounter: Payer: Self-pay | Admitting: Neurology

## 2016-11-12 ENCOUNTER — Ambulatory Visit (INDEPENDENT_AMBULATORY_CARE_PROVIDER_SITE_OTHER): Payer: 59 | Admitting: Neurology

## 2016-11-12 VITALS — BP 138/64 | HR 63 | Ht 61.0 in | Wt 132.0 lb

## 2016-11-12 DIAGNOSIS — I63532 Cerebral infarction due to unspecified occlusion or stenosis of left posterior cerebral artery: Secondary | ICD-10-CM | POA: Diagnosis not present

## 2016-11-12 DIAGNOSIS — R480 Dyslexia and alexia: Secondary | ICD-10-CM | POA: Diagnosis not present

## 2016-11-12 DIAGNOSIS — R413 Other amnesia: Secondary | ICD-10-CM

## 2016-11-12 NOTE — Patient Instructions (Signed)
1. Continue all your medications 2. Refer to speech therapy, along with PT and OT 3. Control of blood pressure, cholesterol, diabetes, as well as physical exercise and brain stimulation exercises are important for brain health 4. No driving until cleared by ophthalmology 5. If symptoms change, go to ER immediately

## 2016-11-12 NOTE — Progress Notes (Signed)
NEUROLOGY CONSULTATION NOTE  Alexis Bray MRN: [id number] DOB: 21-Mar-1956  Referring provider: Dr. Arva Bray Primary care provider:  Dr. Arva Bray  Reason for consult:  Memory loss, recent stroke  Dear Alexis Bray:  Thank you for your kind referral of Alexis Bray for consultation of the above symptoms. Although her history is well known to you, please allow me to reiterate it for the purpose of our medical record. The patient was accompanied to the clinic by her daughter who also provides collateral information. Records and images were personally reviewed where available.  HISTORY OF PRESENT ILLNESS: This is a 61 year old right-handed woman with a history of hypertension, diabetes, presenting for memory loss and recent hospital admission for left PCA stroke. She feels her memory is age-appropriate, she has good and bad days, days she says she knows it. Her daughter started noticing memory changes mid-April. She was calling her daughter a couple of times that she had lost her keys or wallet, people at work would tell her daughter that she was doing this frequently, losing her keys at work or losing her money, thinking someone was stealing it. She had made vacation plans but did not recall making plans or why she was on vacation. She did not recall she was supposed to watch her grandson. At the end of April, she had called her daughter every 15-20 minutes asking her the same question. This concerned her daughter, the patient went home and said she was okay and did not want to go to the hospital. A few days later, a friend noticed the same changes and was able to convince her to go to the ER last 09/28/2016. She was brought in for worsening confusion, exam unremarkable with note of mild anxiety and tearfulness. She was noted to have a glucose level of 300. Head CT showed decreased attenuation in the right posterior limb of the internal capsule, which may represent a small lacunar  infarct. She was discharged home to follow-up with her PCP.  She was treated with an antibiotic for UTI. She had an outpatient MRI brain without contrast on 10/02/16 which I personally reviewed, there was a late subacute infarct in the left occipital lobe, subacute infarcts in the left PCA territory including posteromedial temporal lobe and temporal occipital junction, involvement of the corpus callosum. She has a history of HTN and A1c was 11.9. It appears she was started on aspirin and Plavix. She was brought back to the ER on 10/21/16 after she woke up feeling dizzy, unsteady, with right hemianopia. There was note of progression of infarct on repeat MRI brain. MRA head showed left PCA proximal occlusion, multivessel atherosclerosis including right PCA, bilateral carotid siphon, bilateral M2 and right A2. Carotid dopplers unremarkable. Echo unremarkable. Infarcts secondary to large vessel atherosclerosis, progression could be due to failed penumbra over time or due to hypotention/hypoperfusion. Cardioembolic less likely but could not be completely ruled out, 30-day holter recommended, which she currently is wearing. LDL was 101. She had an EEG which showed focal slowing in the left temporal region.  She presents today with still some cognitive deficits. She cannot recall her age, "I want to say 41." She does not recall the reason for hospital admissions, "I think I had a mild stroke." She had been living alone for the past 2 years, and briefly stayed with her daughter after her hospital stay, now back alone. Her daughter checks on her daily. She does not think she misses medications, her daughter now  fills out her pillbox. She denies getting lost driving, and has stopped driving since the stroke. She denies any missed bill payments, but her daughter reports she had some bills for about a month that were past due. Her daughter has taken over her finances. She had previously been working at [employer] and could not  recall where she was working prior to [place] to [place]   She denies any headaches, dizziness, diplopia, dysarthria/dysphagia, neck/back pain, focal numbness/tingling/weakness, bowel/bladder dysfunction, anosmia, tremors. No family history of dementia, no history of significant head injuries, or alcohol intake. Her daughter reports "good" personality changes, she is now more sweet and vocally expressing her love to her daughter, which is a change for her. No hallucinations, no difficulties with ADLs.   Laboratory Data: Lab Results  Component Value Date   TSH 3.978 10/22/2016   Lab Results  Component Value Date   VITAMINB12 899 10/22/2016   Lab Results  Component Value Date   CHOL 165 09/30/2016   HDL 50.00 09/30/2016   LDLCALC 101 (H) 09/30/2016   TRIG 74.0 09/30/2016   CHOLHDL 3 09/30/2016   Lab Results  Component Value Date   HGBA1C 11.5 (H) 10/22/2016     PAST MEDICAL HISTORY: Past Medical History:  Diagnosis Date  . Diabetes mellitus without complication (HCC)   . History of chicken pox   . Hypertension   . Prediabetes   . TIA (transient ischemic attack)    09/2016, 10/2016    PAST SURGICAL HISTORY: Past Surgical History:  Procedure Laterality Date  . Ankles surgery Right 2005  . CESAREAN SECTION    . EYE SURGERY      MEDICATIONS: Current Outpatient Prescriptions on File Prior to Visit  Medication Sig Dispense Refill  . amLODipine-valsartan (EXFORGE) 5-320 MG tablet Take 1 tablet by mouth daily. 30 tablet 2  . aspirin EC 81 MG tablet Take 81 mg by mouth daily.    Marland Kitchen. atorvastatin (LIPITOR) 80 MG tablet Take 1 tablet (80 mg total) by mouth daily. 30 tablet 3  . clopidogrel (PLAVIX) 75 MG tablet Take 1 tablet (75 mg total) by mouth daily. 30 tablet 3  . glucose blood (ONETOUCH VERIO) test strip Check blood sugar 2-3 times a week.  Dx:E11.65 100 each 12  . metoprolol succinate (TOPROL-XL) 50 MG 24 hr tablet Take 1 tablet (50 mg total) by mouth daily. Take with or  immediately following a meal. 30 tablet 2  . Multiple Vitamins-Minerals (MULTIVITAMIN WITH MINERALS) tablet Take 1 tablet by mouth daily.    [name]. ONETOUCH DELICA LANCETS FINE MISC Check blood sugar 2-3 times a week.  Dx:E11.65 100 each 12  . pioglitazone (ACTOS) 30 MG tablet Take 1 tablet (30 mg total) by mouth daily. 30 tablet 2  . sertraline (ZOLOFT) 50 MG tablet 1/2 tablet by mouth once daily for 1 week, then increase to a full tablet once daily on week two 30 tablet 0   No current facility-administered medications on file prior to visit.     ALLERGIES: No Known Allergies  FAMILY HISTORY: Family History  Problem Relation Age of Onset  . Adopted: Yes    SOCIAL HISTORY: Social History   Social History  . Marital status: Single    Spouse name: N/A  . Number of children: N/A  . Years of education: N/A   Occupational History  . Not on file.   Social History Main Topics  . Smoking status: Never Smoker  . Smokeless tobacco: Never Used  . Alcohol  use Yes     Comment: 1-2 per week  . Drug use: No  . Sexual activity: Not on file   Other Topics Concern  . Not on file   Social History Narrative  . No narrative on file    REVIEW OF SYSTEMS: Constitutional: No fevers, chills, or sweats, no generalized fatigue, change in appetite Eyes: No visual changes, double vision, eye pain Ear, nose and throat: No hearing loss, ear pain, nasal congestion, sore throat Cardiovascular: No chest pain, palpitations Respiratory:  No shortness of breath at rest or with exertion, wheezes GastrointestinaI: No nausea, vomiting, diarrhea, abdominal pain, fecal incontinence Genitourinary:  No dysuria, urinary retention or frequency Musculoskeletal:  No neck pain, back pain Integumentary: No rash, pruritus, skin lesions Neurological: as above Psychiatric: No depression, insomnia, anxiety Endocrine: No palpitations, fatigue, diaphoresis, mood swings, change in appetite, change in weight, increased  thirst Hematologic/Lymphatic:  No anemia, purpura, petechiae. Allergic/Immunologic: no itchy/runny eyes, nasal congestion, recent allergic reactions, rashes  PHYSICAL EXAM: Vitals:   11/12/16 1040  BP: 138/64  Pulse: 63   General: No acute distress Head:  Normocephalic/atraumatic Eyes: Fundoscopic exam shows bilateral sharp discs, no vessel changes, exudates, or hemorrhages Neck: supple, no paraspinal tenderness, full range of motion Back: No paraspinal tenderness Heart: regular rate and rhythm Lungs: Clear to auscultation bilaterally. Vascular: No carotid bruits. Skin/Extremities: No rash, no edema Neurological Exam: Mental status: alert and oriented to person, place, month. States year is 2039. No dysarthria or aphasia, Fund of knowledge is appropriate.  Remote memory intact.  Attention and concentration are normal. Difficulty with reading, she was unable to read "close your eyes" and instead read it as "please ask me what." When asked to read larger print on a magazine, she read 2 words, then spelled out the letters of SHOCK and then said shock.  Able to name objects and repeat phrases. CDT 5/5 MMSE - Mini Mental State Exam 11/12/2016  Orientation to time 1  Orientation to Place 5  Registration 3  Attention/ Calculation 5  Recall 0  Language- name 2 objects 2  Language- repeat 1  Language- follow 3 step command 3  Language- read & follow direction 0  Write a sentence 1  Copy design 1  Total score 22   Cranial nerves: CN I: not tested CN II: pupils equal, round and reactive to light, right homonymous hemianopia, fundi unremarkable. CN III, IV, VI:  full range of motion, no nystagmus, no ptosis CN V: facial sensation intact CN VII: upper and lower face symmetric CN VIII: hearing intact to finger rub CN IX, X: gag intact, uvula midline CN XI: sternocleidomastoid and trapezius muscles intact CN XII: tongue midline Bulk & Tone: normal, no fasciculations. Motor: 5/5  throughout with no pronator drift. Sensation: intact to light touch, cold, pin, vibration and joint position sense.  No extinction to double simultaneous stimulation.  Romberg test negative Deep Tendon Reflexes: +2 throughout, no ankle clonus Plantar responses: downgoing bilaterally Cerebellar: no incoordination on finger to nose, heel to shin. No dysdiadochokinesia Gait: narrow-based and steady, able to tandem walk adequately. Tremor: none  IMPRESSION: This is a pleasant 61 year old right-handed woman with a history of hypertension, diabetes, presenting for memory loss and recent hospital admission for left PCA stroke. Memory changes appear to have started in April 2018, when she was admitted and most likely the left PCA stroke first occurred. There was progression of stroke, with right homonymous hemianopia noted on 10/21/16. Stroke felt secondary to  large vessel occlusion. Her MMSE today is 22/30, neurological exam shows right hemianopia and some alexia. No focal deficits seen. She was discharged on aspirin and Plavix, with plans to transition to Plavix after 3 months. She has a holter monitor today to rule out atrial fibrillation. Continue control of vascular risk factors, we did discuss avoidance of hypotension/hypoperfusion through diseased vessel. We discussed memory changes, would hold off on starting cholinesterase inhibitors for now and re-evaluate 3 months post-stroke. She is undergoing PT and OT, and will be referred for speech therapy for the alexia. We discussed no driving for now due to dense right hemianopia, would re-evaluate with ophthalmology in 3 months. She knows to go to the ER immediately for any change in symptoms.   Thank you for allowing me to participate in the care of this patient. Please do not hesitate to call for any questions or concerns.   Patrcia Dolly, M.D.  CC: Alexis. Carmelia Bray

## 2016-11-15 DIAGNOSIS — R480 Dyslexia and alexia: Secondary | ICD-10-CM | POA: Insufficient documentation

## 2016-11-15 DIAGNOSIS — R413 Other amnesia: Secondary | ICD-10-CM | POA: Insufficient documentation

## 2016-11-25 ENCOUNTER — Ambulatory Visit: Payer: 59 | Attending: Family | Admitting: Rehabilitation

## 2016-11-25 ENCOUNTER — Ambulatory Visit: Payer: 59 | Admitting: Occupational Therapy

## 2016-11-25 ENCOUNTER — Encounter: Payer: Self-pay | Admitting: Occupational Therapy

## 2016-11-25 ENCOUNTER — Encounter: Payer: Self-pay | Admitting: Rehabilitation

## 2016-11-25 DIAGNOSIS — R2681 Unsteadiness on feet: Secondary | ICD-10-CM | POA: Insufficient documentation

## 2016-11-25 DIAGNOSIS — I69318 Other symptoms and signs involving cognitive functions following cerebral infarction: Secondary | ICD-10-CM

## 2016-11-25 DIAGNOSIS — M6281 Muscle weakness (generalized): Secondary | ICD-10-CM | POA: Insufficient documentation

## 2016-11-25 DIAGNOSIS — R2689 Other abnormalities of gait and mobility: Secondary | ICD-10-CM | POA: Diagnosis present

## 2016-11-25 DIAGNOSIS — R41842 Visuospatial deficit: Secondary | ICD-10-CM | POA: Insufficient documentation

## 2016-11-25 NOTE — Patient Instructions (Signed)
Tandem Walking    Walk with each foot directly in front of other, heel of one foot touching toes of other foot with each step. Both feet straight ahead. Make sure you do this by a counter top in the kitchen.  Walk forwards to the end and then walk backwards heel to toe to the other end.  GO SLOW and use very light support.  Repeat x 3 laps and back.     Copyright  VHI. All rights reserved.

## 2016-11-25 NOTE — Therapy (Signed)
[facility] [address] [place], [place], [address] Phone: [phone]   Fax:  [phone]  Physical Therapy Evaluation  Patient Details  Name: Alexis Bray MRN: [id number] Date of Birth: 1955-10-07 Referring Provider: Sandford Craze, NP  Encounter Date: 11/25/2016      PT End of Session - 11/25/16 1950    Visit Number 1   Number of Visits 9   Date for PT Re-Evaluation 12/25/16   Authorization Type [employer]-awaiting to hear full benefits from [place]   PT Start Time 1403   PT Stop Time 1446   PT Time Calculation (min) 43 min   Activity Tolerance Patient tolerated treatment well   Behavior During Therapy [facility] for tasks assessed/performed      Past Medical History:  Diagnosis Date  . Diabetes mellitus without complication (HCC)   . History of chicken pox   . Hypertension   . Prediabetes   . TIA (transient ischemic attack)    09/2016, 10/2016    Past Surgical History:  Procedure Laterality Date  . Ankles surgery Right 2005  . CESAREAN SECTION    . EYE SURGERY      There were no vitals filed for this visit.       Subjective Assessment - 11/25/16 1412    Subjective Pt presents s/p L CVA on 5/17 and reports that she would like to be able to better negotiate in home and in the community adjusting for vision difficulties.    Patient is accompained by: Family member  Alexis Bray, daughter   Limitations House hold activities;Walking   Patient Stated Goals "To move better in home and in community."    Currently in Pain? No/denies            [facility] PT Assessment - 11/25/16 1413      Assessment   Medical Diagnosis L CVA   Referring Provider Sandford Craze, NP   Onset Date/Surgical Date 10/21/16   Prior Therapy acute     Precautions   Precautions --  R hemianopsia     Restrictions   Weight Bearing Restrictions No     Balance Screen   Has the patient fallen in the past 6 months No   Has the patient had  a decrease in activity level because of a fear of falling?  No   Is the patient reluctant to leave their home because of a fear of falling?  No     Home Environment   Living Environment Private residence   Living Arrangements Alone   Available Help at Discharge Family;Available PRN/intermittently   Type of Home Apartment   Home Access Stairs to enter   Entrance Stairs-Number of Steps 3 flights of stairs   Entrance Stairs-Rails Right;Left;Can reach both   Home Layout One level   Home Equipment --  tub/shower     Prior Function   Level of Independence Independent   Vocation Part time employment   Art therapist, Chief of Staff   Leisure read, watch old movies, art work, puzzles, cooking, Product manager   Overall Cognitive Status Impaired/Different from baseline   Attention Sustained;Selective;Alternating   Sustained Attention Impaired   Memory Impaired   Memory Impairment Storage deficit;Retrieval deficit;Decreased recall of new information;Decreased long term memory;Decreased short term memory     Observation/Other Assessments   Focus on Therapeutic Outcomes (FOTO)  Neuro QOL LE 40.1     Sensation   Light Touch Appears Intact   Hot/Cold Appears Intact  Coordination   Gross Motor Movements are Fluid and Coordinated Yes  in LEs   Fine Motor Movements are Fluid and Coordinated Yes  in LEs     ROM / Strength   AROM / PROM / Strength Strength     Strength   Overall Strength Within functional limits for tasks performed   Overall Strength Comments grossly WFL (hip flex 3+/5)     Transfers   Transfers Sit to Stand;Stand to Sit   Sit to Stand 7: Independent   Stand to Sit 7: Independent     Ambulation/Gait   Ambulation/Gait Yes   Ambulation/Gait Assistance 5: Supervision   Ambulation/Gait Assistance Details Note that she does tend to come very close to items on the R due to field cut.  Mild sway when ambulating outdoors over uneven surfaces.     Ambulation Distance (Feet) 600 Feet   Assistive device None   Gait Pattern Step-through pattern   Ambulation Surface Level;Indoor;Unlevel;Outdoor;Paved;Grass   Gait velocity 4.08 ft/sec    Stairs Yes   Stairs Assistance 6: Modified independent (Device/Increase time)   Stair Management Technique No rails;Alternating pattern;Forwards   Number of Stairs 4   Height of Stairs 6     Functional Gait  Assessment   Gait assessed  Yes   Gait Level Surface Walks 20 ft in less than 7 sec but greater than 5.5 sec, uses assistive device, slower speed, mild gait deviations, or deviates 6-10 in outside of the 12 in walkway width.   Change in Gait Speed Able to smoothly change walking speed without loss of balance or gait deviation. Deviate no more than 6 in outside of the 12 in walkway width.   Gait with Horizontal Head Turns Performs head turns smoothly with slight change in gait velocity (eg, minor disruption to smooth gait path), deviates 6-10 in outside 12 in walkway width, or uses an assistive device.   Gait with Vertical Head Turns Performs task with slight change in gait velocity (eg, minor disruption to smooth gait path), deviates 6 - 10 in outside 12 in walkway width or uses assistive device   Gait and Pivot Turn Pivot turns safely within 3 sec and stops quickly with no loss of balance.   Step Over Obstacle Is able to step over 2 stacked shoe boxes taped together (9 in total height) without changing gait speed. No evidence of imbalance.   Gait with Narrow Base of Support Ambulates 4-7 steps.   Gait with Eyes Closed Walks 20 ft, uses assistive device, slower speed, mild gait deviations, deviates 6-10 in outside 12 in walkway width. Ambulates 20 ft in less than 9 sec but greater than 7 sec.   Ambulating Backwards Walks 20 ft, uses assistive device, slower speed, mild gait deviations, deviates 6-10 in outside 12 in walkway width.   Steps Alternating feet, no rail.   Total Score 23   FGA comment:  19-24 = medium risk fall            Objective measurements completed on examination: See above findings.                  PT Education - 11/25/16 1949    Education provided Yes   Education Details education on POC, addressing high level balance and associated compensations with hemianopsia, goals.    Person(s) Educated Patient;Child(ren)   Methods Explanation   Comprehension Verbalized understanding          PT Short Term Goals - 11/25/16 1958  PT SHORT TERM GOAL #1   Title =LTGs           PT Long Term Goals - 11/25/16 1958      PT LONG TERM GOAL #1   Title Pt will be independent with HEP in order to indicate improved functional mobility and decreased fall risk.  (Target Date: 12/25/16)   Time 4   Period Weeks   Status New     PT LONG TERM GOAL #2   Title Pt will improve FGA to >25/30 in order to indicate decreased fall risk.     Time 4   Period Weeks   Status New     PT LONG TERM GOAL #3   Title Will assess and improve distance by 150' in order to indicate improved functional endurance.     Time 4   Period Weeks   Status New     PT LONG TERM GOAL #4   Title Pt will ambulate in busy environment while scanning for targets without overt LOB in order to indicate safe negotiation in community.    Time 4   Period Weeks   Status New                Plan - 11/25/16 1951    Clinical Impression Statement Pt presents s/p L CVA with R visual field cut and high level balance and endurance deficits.  Note memory deficits in recent history that seem to be premorbid rather than related to CVA, note has SLP evaluation scheduled to further address these issues.  Upon PT evaluation, note gait speed WFL, however do note that she tends to ambulate very closely to objects on the R side and negotiates stairs very quickly.  FGA score was 23/30 indicative of medium fall risk.  Will formally assess endurance deficits at next visit.  Pt is of stable  condition and low complexity from a PT POC standpoint.  Pt will benefit from skilled OP neuro PT in order to address deficits.     History and Personal Factors relevant to plan of care: memory loss, DM, HTN   Clinical Presentation Stable   Clinical Presentation due to: see above   Clinical Decision Making Low   Rehab Potential Excellent   Clinical Impairments Affecting Rehab Potential poor cognition   PT Frequency 2x / week   PT Duration 4 weeks  may not need whole time   PT Treatment/Interventions ADLs/Self Care Home Management;Gait training;Functional mobility training;Therapeutic activities;Therapeutic exercise;Balance training;Neuromuscular re-education;Patient/family education;Cognitive remediation;Vestibular   PT Next Visit Plan , Continue with high level balance, compliant surfaces, gait with obstacles related to scanning environment   PT Home Exercise Plan tandem gait 11/25/16   Consulted and Agree with Plan of Care Patient;Family member/caregiver   Family Member Consulted daughter Alexis Bray      Patient will benefit from skilled therapeutic intervention in order to improve the following deficits and impairments:  Decreased activity tolerance, Decreased balance, Decreased cognition, Impaired perceived functional ability, Impaired vision/preception  Visit Diagnosis: Unsteadiness on feet - Plan: PT plan of care cert/re-cert  Muscle weakness (generalized) - Plan: PT plan of care cert/re-cert  Other abnormalities of gait and mobility - Plan: PT plan of care cert/re-cert     Problem List Patient Active Problem List   Diagnosis Date Noted  . Alexia 11/15/2016  . Memory loss 11/15/2016  . Stroke (cerebrum) (HCC) 10/22/2016  . Arterial ischemic stroke, PCA (posterior cerebral artery), left, acute (HCC) 10/21/2016  . Essential hypertension 10/21/2016  .  Controlled type 2 diabetes mellitus without complication, without long-term current use of insulin (HCC) 10/21/2016  .  Microcytic anemia 10/21/2016    Harriet Butte, PT, MPT [facility] [address] [place], [place], [address] Phone: [phone]   Fax:  [phone] 11/25/16, 8:12 PM  Name: Alexis Bray MRN: [id number] Date of Birth: June 02, 1956

## 2016-11-25 NOTE — Therapy (Signed)
[facility] [address] [place], [place], [address] Phone: [phone]   Fax:  [phone]  Occupational Therapy Evaluation  Patient Details  Name: Alexis Bray MRN: [id number] Date of Birth: 09/20/1955 Referring Provider: Daryel GeraldMelissa Sullivan  Encounter Date: 11/25/2016      OT End of Session - 11/25/16 1620    Visit Number 1   Number of Visits 8   Date for OT Re-Evaluation 12/23/16   [facility]    OT Start Time 1316   OT Stop Time 1359   OT Time Calculation (min) 43 min   Activity Tolerance Patient tolerated treatment well      Past Medical History:  Diagnosis Date  . Diabetes mellitus without complication (HCC)   . History of chicken pox   . Hypertension   . Prediabetes   . TIA (transient ischemic attack)    09/2016, 10/2016    Past Surgical History:  Procedure Laterality Date  . Ankles surgery Right 2005  . CESAREAN SECTION    . EYE SURGERY      There were no vitals filed for this visit.      Subjective Assessment - 11/25/16 1319    Subjective  I am not really sure what I am doing here.    Patient is accompained by: Family member  dtr   Pertinent History see epic Pt with memory issues since mid April . Pt with L CVA on 10/21/2016.  R hemianopsia   Patient Stated Goals able to get around like I used to - making sure I don't run into things but I haven't had any issues    Currently in Pain? No/denies           [facility] - 11/25/16 0001      Assessment   Diagnosis L PCA CVA   Referring Provider Daryel GeraldMelissa Sullivan   Onset Date 10/21/16   Prior Therapy No prior therapy     Precautions   Precautions None     Restrictions   Weight Bearing Restrictions No     Balance Screen   Has the patient fallen in the past 6 months No     Home  Environment   Family/patient expects to be discharged to: Private residence   Living Arrangements Alone   Available Help at Discharge --  dtr and son in law check on pt 2 times per day   Type of Home Aartment   Home Layout One level   Bathroom Museum/gallery exhibitions officerhower/Tub Tub/Shower unit   Bathroom Toilet Standard   Additional Comments Pt reports no equipment in bathroom     Prior Function   Level of Independence Independent   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, Chief of Staffstocking shelves   Leisure read, watch old movies, art work, puzzles, cooking, cleaning     ADL   Eating/Feeding Modified independent   Grooming Independent   Magazine features editorUpper Body Bathing Independent   Lower Body Bathing Independent   Upper Body Dressing Independent   Lower Body Dressing Independent   Toilet Transfer Independent   Toileting - Conservator, museum/galleryClothing Manipulation Independent   Toileting -  Tour managerHygiene Independent   Tub/Shower Transfer Independent     IADL   Shopping Assistance for transportation;Needs to be accompanied on any shopping trip   Light Housekeeping Maintains house alone or with occasional assistance   Meal Prep Able to complete simple warm meal prep  pt is doing some simple things and dtr is  fixing some meals   Community Mobility Relies on family or friends for transportation   Medication Management Has difficulty remembering to take medication  dtr sets up meds; pt remembers "most of the time"   Financial Management Requires assistance     Mobility   Mobility Status Needs assist   Mobility Status Comments Pt needs supervision when ambulating in the community with supervision due to her vision     Written Expression   Dominant Hand Right     Vision - History   Baseline Vision Wears glasses all the time   Visual History Other (comment)  pt R lazy eye since birth;pt/dtr state no worse since stroke   Additional Comments "I don't see things on the right"     Vision Assessment   Eye Alignment Impaired (comment)  R lazy eye since birth; basline   Comment Pt with R hemianopsia.  Pt is aware of this and states  "I have to turn my head to the right to see things"     Activity Tolerance   Activity Tolerance Tolerate 30+ min activity without fatigue     Cognition   Overall Cognitive Status Impaired/Different from baseline   Mini Mental State Exam  Pt's functional memory deficits began to appear in mid April and have the appearance of dementia - pt can't find keys, suspects people coming into her home to steal things, can't remember her own age, asks dtr same questions over and over, feels events in past are dreams.  MOCA score= 18/30.     Attention Sustained;Selective;Alternating   Memory Impaired   Memory Impairment Storage deficit;Retrieval deficit;Decreased recall of new information;Decreased long term memory;Decreased short term memory     Sensation   Light Touch Appears Intact   Hot/Cold Appears Intact     Coordination   Gross Motor Movements are Fluid and Coordinated Yes   Fine Motor Movements are Fluid and Coordinated Yes   Finger Nose Finger Test WFL's     Perception   Perception Impaired   Spatial Orientation due to R hemianopsia     ROM / Strength   AROM / PROM / Strength AROM;Strength     AROM   Overall AROM  Within functional limits for tasks performed   Overall AROM Comments BUE's     Strength   Overall Strength Within functional limits for tasks performed   Overall Strength Comments BUE's     Hand Function   Right Hand Gross Grasp Functional   Right Hand Grip (lbs) 60   Left Hand Gross Grasp Functional   Left Hand Grip (lbs) 53                  OT Treatments/Exercises (OP) - 11/25/16 1529      ADLs   ADL Comments educated pt and dtr on what hemianopsia is and general strategies for scanning.  Role of ST in cognitive issues.                OT Education - 11/25/16 1516    Education provided Yes   Education Details Educated pt/dtr on what hemianopsia is, why glasses won't help, discussed in general visual strategies. Also discussed role of ST in  addressing memory issues   Person(s) Educated Patient;Child(ren)   Methods Explanation   Comprehension Verbalized understanding             OT Long Term Goals - 11/25/16 1517      OT LONG TERM GOAL #1   Title  Pt and dtr will be mod I with HEP - 12/23/2016   Status New     OT LONG TERM GOAL #2   Title Pt and dtr will be able to identify at least 2 visual strategies to compensate for hemianopsia   Status New     OT LONG TERM GOAL #3   Title Pt will complete table top scanning activity (i.e simple reading) with at least 90% accuracy with min cues for strategies   Status New     OT LONG TERM GOAL #4   Title Pt will demonstrate at least 90% accuracy for environmental scanning activities.    Status New     OT LONG TERM GOAL #5   Title Pt will be supervision for hot meal prep.    Status New               Plan - 11/25/16 1522    Clinical Impression Statement Pt is a 61 year old female s/p L PCA CVA on 10/21/2016.  Dtr reports pt has had a slow decline memory wise since April of this year.  Pt discharged on 10/23/2016.  Pt presents today with the following deficits that impact IADL's, leisure and work:  impaired memory, decreased insight, R hemianopsia.  Pt will benefit from skilled OT to address these deficts as well as ST to address memory deficits. Pt and dtr in agreement.    Occupational Profile and client history currently impacting functional performance PMH: HTN, diabetes, TIA's, depression, CT shows old small infarcts as well.    Occupational performance deficits (Please refer to evaluation for details): IADL's;Work;Leisure   Rehab Potential Fair   Current Impairments/barriers affecting progress: slow cognitive decline   OT Frequency 2x / week   OT Duration 4 weeks   OT Treatment/Interventions Self-care/ADL training;Functional Mobility Training;Therapeutic activities;Visual/perceptual remediation/compensation;Patient/family education   Plan assess reading, table top  scanning, environmental scanning, scanning strategies   Consulted and Agree with Plan of Care Patient;Family member/caregiver   Family Member Consulted dtr      Patient will benefit from skilled therapeutic intervention in order to improve the following deficits and impairments:  Decreased cognition, Decreased safety awareness, Impaired vision/preception  Visit Diagnosis: Visuospatial deficit - Plan: Ot plan of care cert/re-cert  Other symptoms and signs involving cognitive functions following cerebral infarction - Plan: Ot plan of care cert/re-cert    Problem List Patient Active Problem List   Diagnosis Date Noted  . Alexia 11/15/2016  . Memory loss 11/15/2016  . Stroke (cerebrum) (HCC) 10/22/2016  . Arterial ischemic stroke, PCA (posterior cerebral artery), left, acute (HCC) 10/21/2016  . Essential hypertension 10/21/2016  . Controlled type 2 diabetes mellitus without complication, without long-term current use of insulin (HCC) 10/21/2016  . Microcytic anemia 10/21/2016    Norton Pastel, OTR/L 11/25/2016, 4:25 PM  [facility] [address] [place], [place], [address] Phone: [phone]   Fax:  [phone]  Name: Alexis Bray MRN: [id number] Date of Birth: 1955-06-19

## 2016-11-26 ENCOUNTER — Ambulatory Visit (INDEPENDENT_AMBULATORY_CARE_PROVIDER_SITE_OTHER): Payer: 59 | Admitting: Family Medicine

## 2016-11-26 ENCOUNTER — Encounter: Payer: Self-pay | Admitting: Family Medicine

## 2016-11-26 VITALS — BP 160/58 | HR 87 | Temp 98.7°F | Ht 61.0 in | Wt 142.4 lb

## 2016-11-26 DIAGNOSIS — F32A Depression, unspecified: Secondary | ICD-10-CM

## 2016-11-26 DIAGNOSIS — F329 Major depressive disorder, single episode, unspecified: Secondary | ICD-10-CM

## 2016-11-26 DIAGNOSIS — I10 Essential (primary) hypertension: Secondary | ICD-10-CM | POA: Diagnosis not present

## 2016-11-26 MED ORDER — SERTRALINE HCL 50 MG PO TABS: 50.0000 mg | ORAL_TABLET | Freq: Every day | ORAL | 1 refills | Status: AC

## 2016-11-26 MED ORDER — METOPROLOL SUCCINATE ER 100 MG PO TB24: 100.0000 mg | ORAL_TABLET | Freq: Every day | ORAL | 3 refills | Status: AC

## 2016-11-26 NOTE — Patient Instructions (Signed)
Take 2 tabs of the 50 mg dose of metoprolol until you run out. A new dose has been called in.   OK to check blood pressure 2 times per day, 1 minute apart. Check twice per week until we see each other next.  Let us know if you need anything.

## 2016-11-26 NOTE — Progress Notes (Signed)
Chief Complaint  Patient presents with  . Follow-up    4 weeks-pt states doing good    Subjective Alexis Bray is a 61 y.o. female who presents for hypertension follow up. Here with daughter. She does monitor home blood pressures. Blood pressures ranging from 150-160's/70-80's on average. She is compliant with medications. Patient has these side effects of medication: none She is adhering to a healthy diet overall. Current exercise: active in apartment.   Another provider started her on Zoloft 50 mg daily. She feels much better since starting on it. She is tolerating well and taking it every day. No SI or HI.   Past Medical History:  Diagnosis Date  . Diabetes mellitus without complication (HCC)   . History of chicken pox   . Hypertension   . Prediabetes   . TIA (transient ischemic attack)    09/2016, 10/2016   Family History  Problem Relation Age of Onset  . Adopted: Yes     Medications Current Outpatient Prescriptions on File Prior to Visit  Medication Sig Dispense Refill  . amLODipine-valsartan (EXFORGE) 5-320 MG tablet Take 1 tablet by mouth daily. 30 tablet 2  . aspirin EC 81 MG tablet Take 81 mg by mouth daily.    Marland Kitchen. atorvastatin (LIPITOR) 80 MG tablet Take 1 tablet (80 mg total) by mouth daily. 30 tablet 3  . clopidogrel (PLAVIX) 75 MG tablet Take 1 tablet (75 mg total) by mouth daily. 30 tablet 3  . glucose blood (ONETOUCH VERIO) test strip Check blood sugar 2-3 times a week.  Dx:E11.65 100 each 12  . metoprolol succinate (TOPROL-XL) 50 MG 24 hr tablet Take 1 tablet (50 mg total) by mouth daily. Take with or immediately following a meal. 30 tablet 2  . Multiple Vitamins-Minerals (MULTIVITAMIN WITH MINERALS) tablet Take 1 tablet by mouth daily.    Alexis Bray. ONETOUCH DELICA LANCETS FINE MISC Check blood sugar 2-3 times a week.  Dx:E11.65 100 each 12  . pioglitazone (ACTOS) 30 MG tablet Take 1 tablet (30 mg total) by mouth daily. 30 tablet 2  . sertraline (ZOLOFT) 50 MG tablet  1/2 tablet by mouth once daily for 1 week, then increase to a full tablet once daily on week two 30 tablet 0   Allergies No Known Allergies  Review of Systems Cardiovascular: no chest pain Respiratory:  no shortness of breath  Exam BP (!) 160/58 (BP Location: Right Arm, Patient Position: Sitting, Cuff Size: Normal)   Pulse 87   Temp 98.7 F (37.1 C) (Oral)   Ht 5\' 1"  (1.549 m)   Wt 142 lb 6.4 oz (64.6 kg)   SpO2 98%   BMI 26.91 kg/m  General:  well developed, well nourished, in no apparent distress Skin:  warm, no pallor or diaphoresis Eyes:  pupils equal and round, sclera anicteric without injection Heart :RRR, no murmurs, no bruits, no LE edema Lungs:  clear to auscultation, no accessory muscle use Psych: well oriented with normal range of affect and appropriate judgment/insight  Essential hypertension - Plan: metoprolol succinate (TOPROL-XL) 100 MG 24 hr tablet  Depression, unspecified depression type - Plan: sertraline (ZOLOFT) 50 MG tablet  Orders as above. Increase dose of Metoprolol. She does not snore. Will consider 2ndary HTN workup. Add spironolactone vs chlorthalidone.  Counseled on diet and exercise. Cont Zoloft for now. Reported that neurology team stated that brain may take 3 mo to heal.  F/u in 1 mo. The patient and daughter voiced understanding and agreement to the plan.  Jilda Roche Dalton, DO 11/26/16  2:40 PM

## 2016-11-29 ENCOUNTER — Ambulatory Visit: Payer: 59 | Admitting: Rehabilitation

## 2016-11-30 ENCOUNTER — Ambulatory Visit: Payer: 59 | Admitting: Occupational Therapy

## 2016-12-02 ENCOUNTER — Encounter: Payer: Self-pay | Admitting: Occupational Therapy

## 2016-12-02 DIAGNOSIS — R41842 Visuospatial deficit: Secondary | ICD-10-CM

## 2016-12-02 NOTE — Therapy (Signed)
[facility] [address] [place], [place], [address] Phone: [phone]   Fax:  [phone]  Occupational Therapy Treatment  Patient Details  Name: Alexis Bray MRN: [id number] Date of Birth: 1956-06-04 Referring Provider: Lemar Livings  Encounter Date: 12/02/2016    Past Medical History:  Diagnosis Date  . Diabetes mellitus without complication ([place])   . History of chicken pox   . Hypertension   . Prediabetes   . TIA (transient ischemic attack)    09/2016, 10/2016    Past Surgical History:  Procedure Laterality Date  . Ankles surgery Right 2005  . CESAREAN SECTION    . EYE SURGERY      There were no vitals filed for this visit.                                 OT Long Term Goals - 12/02/16 1255      OT LONG TERM GOAL #1   Title Pt and dtr will be mod I with HEP - 12/23/2016   Status Unable to assess     OT LONG TERM GOAL #2   Title Pt and dtr will be able to identify at least 2 visual strategies to compensate for hemianopsia   Status Unable to assess     OT LONG TERM GOAL #3   Title Pt will complete table top scanning activity (i.e simple reading) with at least 90% accuracy with min cues for strategies   Status Unable to assess     OT LONG TERM GOAL #4   Title Pt will demonstrate at least 90% accuracy for environmental scanning activities.    Status Unable to assess     OT LONG TERM GOAL #5   Title Pt will be supervision for hot meal prep.    Status Unable to assess               Plan - 12/02/16 1256    Clinical Impression Statement Pt has decided not to return to OT due to financial reasons.  Will d/c from OT per pt request.       Patient will benefit from skilled therapeutic intervention in order to improve the following deficits and impairments:     Visit Diagnosis: Visuospatial deficit    Problem List Patient Active Problem List   Diagnosis Date  Noted  . Alexia 11/15/2016  . Memory loss 11/15/2016  . Stroke (cerebrum) ([facility] [place]) 10/22/2016  . Arterial ischemic stroke, PCA (posterior cerebral artery), left, acute ([place]) 10/21/2016  . Essential hypertension 10/21/2016  . Controlled type 2 diabetes mellitus without complication, without long-term current use of insulin ([place]) 10/21/2016  . Microcytic anemia 10/21/2016   OCCUPATIONAL THERAPY DISCHARGE SUMMARY  Visits from Start of Care: 1  Current functional level related to goals / functional outcomes: See above   Remaining deficits: Unknown pt did not return following OT eval   Education / Equipment: none Plan: Patient agrees to discharge.  Patient goals were not met. Patient is being discharged due to financial reasons.  ?????      Eshani, Maestre , OTR/L 12/02/2016, 12:57 PM  [facility] [address] [place], [place], [address] Phone: [phone]   Fax:  [phone]  Name: Alexis Bray MRN: [id number] Date of Birth: 01-13-1956

## 2016-12-07 ENCOUNTER — Encounter: Payer: 59 | Admitting: Occupational Therapy

## 2016-12-07 ENCOUNTER — Telehealth: Payer: Self-pay | Admitting: *Deleted

## 2016-12-07 ENCOUNTER — Ambulatory Visit: Payer: 59 | Attending: Family

## 2016-12-07 DIAGNOSIS — R41841 Cognitive communication deficit: Secondary | ICD-10-CM | POA: Insufficient documentation

## 2016-12-07 NOTE — Telephone Encounter (Signed)
Received request for Medical records from [facility] [employer], forwarded to [place] for email/scan/SLS 07/03    

## 2016-12-07 NOTE — Patient Instructions (Addendum)
Word searches are good for concentration - try to do something else at the same time. Memory games would also be a good idea. Begin to have Clydie BraunKaren watch Nicki organize the medications and take notes on how to do that.

## 2016-12-07 NOTE — Therapy (Signed)
[facility] [address] [place], [place], [address] Phone: [phone]   Fax:  [phone]  Speech Language Pathology Evaluation  Patient Details  Name: Alexis Bray MRN: [id number] Date of Birth: [birth year] Referring Provider: Patrcia Dolly MD  Encounter Date: 12/07/2016      End of Session - 12/07/16 1654    Visit Number 1   Number of Visits 9   Date for SLP Re-Evaluation 03/04/17   SLP Start Time 1538   SLP Stop Time  1625   SLP Time Calculation (min) 47 min   Activity Tolerance Patient tolerated treatment well      Past Medical History:  Diagnosis Date  . Diabetes mellitus without complication ([facility])   . History of chicken pox   . Hypertension   . Prediabetes   . TIA (transient ischemic attack)    09/2016, 10/2016    Past Surgical History:  Procedure Laterality Date  . Ankles surgery Right 2005  . CESAREAN SECTION    . EYE SURGERY      There were no vitals filed for this visit.      Subjective Assessment - 12/07/16 1546    Subjective "I should say I haven't been released to return to work."   Patient is accompained by: Clair Gulling, daughter   Currently in Pain? No/denies            SLP Evaluation [facility] - 12/07/16 1546      SLP Visit Information   SLP Received On 12/07/16   Referring Provider Patrcia Dolly MD   Onset Date April 2018   Medical Diagnosis CVA     General Information   HPI Pt with CVA while at lunch with friend. Unsure of date. Pt asking daughter some details of medical history and of timing of medical events. Daughter organizing pt's meds at this time.     Prior Functional Status   Cognitive/Linguistic Baseline Within functional limits   Type of Home Apartment    Lives With Alone   Available Support Family   Vocation Part time employment     Cognition   Overall Cognitive Status Impaired/Different from baseline   Attention Focused;Sustained;Selective   Memory Impaired    Memory Impairment Storage deficit;Decreased recall of new information;Decreased long term memory;Decreased short term memory  reported decr'd long term (daughter)     Auditory Comprehension   Overall Auditory Comprehension Appears within functional limits for tasks assessed     Verbal Expression   Overall Verbal Expression Appears within functional limits for tasks assessed     Motor Speech   Overall Motor Speech Appears within functional limits for tasks assessed     Standardized Assessments   Standardized Assessments  Scales of Cognitive Ability for Traumatic Brain Injury (SCATBI);Other Assessment  hopkins verbal learning test   Other Assessment Recall: 5(WNL), 6(below WNL), 9(WNL); Recognition: 8 (below WNL)  As time progressed, pt's auditory memory ability decr'd                         SLP Education - 12/07/16 1654    Education provided Yes   Education Details ST testing, results, POC, home tasks for cognition/memory   Person(s) Educated Patient;Child(ren)   Methods Explanation   Comprehension Verbalized understanding          SLP Short Term Goals - 12/07/16 1659      SLP SHORT TERM GOAL #1   Title Pt will tell SLP 4  memory strategies/compensations with modified independence    Time 4   Period --  visits   Status New     SLP SHORT TERM GOAL #2   Title pt will use a memory strategy in at least one therapy session   Time 4   Period --  visits   Status New     SLP SHORT TERM GOAL #3   Title pt/daughter will report pt using at least one strategy outside of therapy session   Time 4   Period --  visits   Status New     SLP SHORT TERM GOAL #4   Title pt will demo alternating attention between two simple cognitive linguistic tasks achieving 95% on both tasks after PRN self-correction   Time 4   Period --  visits   Status New          SLP Long Term Goals - 12/07/16 1702      SLP LONG TERM GOAL #1   Title pt will demo divided  attention with simple cognitive communication tasks   Time 8   Period --  visits   Status New     SLP LONG TERM GOAL #2   Title pt will use two memory strategies between appointments as reported by pt/daughter   Time 8   Period --  visits   Status New     SLP LONG TERM GOAL #3   Title pt will demo knowledge of how to use memory compensation system   Time 8   Period --  visits   Status New          Plan - 12/07/16 1655    Clinical Impression Statement Pt presents today with cognitive communicaiton deficits in the areas of memory and probably some degree of higher-level attention. Currently pt's meds are organized by pt's daughter. 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 Skilled ST req'd to improve pt's cognitive commnication skills to enable pt to return to her PLOF.   Speech Therapy Frequency 1x /week   Duration --  8 weeks or 8 visits   Treatment/Interventions Compensatory techniques;Internal/external aids;SLP instruction and feedback;Cognitive reorganization;Environmental controls;Multimodal communcation approach;Functional tasks;Patient/family education;Cueing hierarchy   Potential to Achieve Goals Good   Potential Considerations Financial resources   Consulted and Agree with Plan of Care Patient;Family member/caregiver   Family Member Consulted Nicki, daughter      Patient will benefit from skilled therapeutic intervention in order to improve the following deficits and impairments:   Cognitive communication deficit    Problem List Patient Active Problem List   Diagnosis Date Noted  . Alexia 11/15/2016  . Memory loss 11/15/2016  . Stroke (cerebrum) ([facility]) 10/22/2016  . Arterial ischemic stroke, PCA (posterior cerebral artery), left, acute ([facility]) 10/21/2016  . Essential hypertension 10/21/2016  . Controlled type 2 diabetes mellitus without complication, without long-term current use of insulin ([facility])  10/21/2016  . Microcytic anemia 10/21/2016    SCHINKE,CARL ,MS, CCC-SLP  12/07/2016, 5:09 PM  [facility] [address] [place], [place], [address] Phone: [phone]   Fax:  [phone]  Name: Alexis Bray MRN: [id number] Date of Birth: 07/14/1955

## 2016-12-09 ENCOUNTER — Ambulatory Visit: Payer: 59 | Admitting: Rehabilitation

## 2016-12-15 ENCOUNTER — Ambulatory Visit: Payer: 59 | Admitting: Physical Therapy

## 2016-12-15 ENCOUNTER — Encounter: Payer: 59 | Admitting: Occupational Therapy

## 2016-12-16 ENCOUNTER — Ambulatory Visit: Payer: 59 | Admitting: Physical Therapy

## 2016-12-16 ENCOUNTER — Ambulatory Visit: Payer: 59

## 2016-12-16 ENCOUNTER — Encounter: Payer: 59 | Admitting: Occupational Therapy

## 2016-12-20 ENCOUNTER — Telehealth: Payer: Self-pay | Admitting: *Deleted

## 2016-12-20 ENCOUNTER — Encounter: Payer: Self-pay | Admitting: Rehabilitation

## 2016-12-20 NOTE — Telephone Encounter (Signed)
[facility], forwarded to [place] for email/scan/SLS 07/16

## 2016-12-20 NOTE — Therapy (Signed)
[facility] [address] [place], [place], [address] Phone: [phone]   Fax:  [phone]  Patient Details  Name: Alexis Bray MRN: [id number] Date of Birth: 03/23/1956 Referring Provider:  No ref. provider found  Encounter Date: 12/20/2016   PHYSICAL THERAPY DISCHARGE SUMMARY  Visits from Start of Care: 1  Current functional level related to goals / functional outcomes:     PT Long Term Goals - 11/25/16 1958      PT LONG TERM GOAL #1   Title Pt will be independent with HEP in order to indicate improved functional mobility and decreased fall risk.  (Target Date: 12/25/16)   Time 4   Period Weeks   Status New     PT LONG TERM GOAL #2   Title Pt will improve FGA to >25/30 in order to indicate decreased fall risk.     Time 4   Period Weeks   Status New     PT LONG TERM GOAL #3   Title Will assess 6MWT and improve distance by 150' in order to indicate improved functional endurance.     Time 4   Period Weeks   Status New     PT LONG TERM GOAL #4   Title Pt will ambulate in busy environment while scanning for targets without overt LOB in order to indicate safe negotiation in community.    Time 4   Period Weeks   Status New        Remaining deficits: Unsure as pt did not return.   Note that she did not have any therapy coverage from insurance and did not wish to return due to this reason.    Education / Equipment: n/a  Plan: Patient agrees to discharge.  Patient goals were not met. Patient is being discharged due to financial reasons.  ?????        Cameron Sprang, PT, MPT [facility] [address] [place], [place], [address] Phone: [phone]   Fax:  [phone] 12/20/16, 12:12 PM

## 2016-12-21 ENCOUNTER — Encounter: Payer: 59 | Admitting: Occupational Therapy

## 2016-12-21 ENCOUNTER — Ambulatory Visit: Payer: 59 | Admitting: Physical Therapy

## 2016-12-21 ENCOUNTER — Ambulatory Visit: Payer: 59

## 2016-12-24 ENCOUNTER — Ambulatory Visit: Payer: 59 | Admitting: Rehabilitation

## 2016-12-24 ENCOUNTER — Encounter: Payer: 59 | Admitting: Occupational Therapy

## 2016-12-27 ENCOUNTER — Ambulatory Visit: Payer: 59 | Admitting: Family Medicine

## 2016-12-27 ENCOUNTER — Telehealth: Payer: Self-pay | Admitting: Family Medicine

## 2016-12-27 NOTE — Telephone Encounter (Signed)
Daughter apologized for the short notice but unable to bring patient in for her 3:15pm appointment today, charge or no charge

## 2016-12-27 NOTE — Telephone Encounter (Signed)
No charge. 

## 2016-12-28 ENCOUNTER — Ambulatory Visit: Payer: 59 | Admitting: Physical Therapy

## 2016-12-28 ENCOUNTER — Encounter: Payer: 59 | Admitting: Occupational Therapy

## 2016-12-30 ENCOUNTER — Ambulatory Visit: Payer: 59 | Admitting: Speech Pathology

## 2016-12-30 ENCOUNTER — Encounter: Payer: 59 | Admitting: Occupational Therapy

## 2016-12-30 ENCOUNTER — Ambulatory Visit: Payer: 59 | Admitting: Rehabilitation

## 2017-01-03 NOTE — Progress Notes (Signed)
[facility]  PATIENT: Alexis HaggisKaren Bray DOB: 04/04/1956   REASON FOR VISIT: Hospital follow-up for stroke  HISTORY FROM: Patient and daughter at the    HISTORY OF PRESENT ILLNESS:FROM RECORDKaren Grantis an 61 y.o.femalewith a history of TIA, diabetes mellitus and hypertension [facility] for new onset visual changes involving right visual field. Onset of symptoms was unclear based on medical record notes. CT scan of her head showed left PCA territory watershed subacute to chronic infarctions. No clear acute intercranial lesion was noted. Patient had a recent TIA and reportedly has had residual memory difficulty. She's been taking aspirin and Plavix daily. She has not experienced a change in speech and no facial droop has been reported. She's had no weakness and numbness involving extremities. His LKW is unclear, possible on 10/20/2016. Patient was not administered IV t-PA secondary to unclear time of onset of symptoms. She was admitted for further evaluation and treatment. SUBJECTIVE (INTERVAL HISTORY) No family is at bedside. 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 [facility] for right PCA scattered infarct. And this time was told to go get more tests by PCP, but after MRI repeat, she was sent to ER for admission. MRI repeat did show progression from previous MRI but pt has no neuro changes since discharge. MRA. Head left PCA proximal occlusion . Carotid Doppler unremarkable. 2-D echo EF 60-65%. LDL 101. Hemoglobin A1c 11.9 EEG mildly abnormal due to abnormal intermittent slowing in the left temporal region no seizure activity. She is still driving and I told her that she can not drive due to hemianopia.  Interval history 07/31/2018CM patient returns for hospital follow-up after stroke in April. She is currently on Plavix and aspirin for secondary stroke prevention for 3 months and then Plavix alone. She has not had further stroke or TIA symptoms. She has  minimal bruising and bleeding. In addition she is on Lipitor without myalgias. CBC this morning 100. Blood pressure today 140/80. She continues to have right hemianopsia and has been seeing ophthalmology. She is exercising by walking. She returns for reevaluation Cardiac event monitorSinus rhythm,No AV block or pauses,4 beat run of nonsustained ventricular tachycardia is noted on 11/24/16,No other arrhythmias REVIEW OF SYSTEMS: Full 14 system review of systems performed and notable only for those listed, all others are neg:  Constitutional: neg  Cardiovascular: neg Ear/Nose/Throat: neg  Skin: neg Eyes: Loss of vision Respiratory: neg Gastroitestinal: neg  Hematology/Lymphatic: neg  Endocrine: neg Musculoskeletal:neg Allergy/Immunology: neg Neurological: neg Psychiatric: Depression Sleep : neg   ALLERGIES: No Known Allergies  HOME MEDICATIONS: Outpatient Medications Prior to Visit  Medication Sig Dispense Refill  . amLODipine-valsartan (EXFORGE) 5-320 MG tablet Take 1 tablet by mouth daily. 30 tablet 2  . aspirin EC 81 MG tablet Take 81 mg by mouth daily.    Marland Kitchen. atorvastatin (LIPITOR) 80 MG tablet Take 1 tablet (80 mg total) by mouth daily. 30 tablet 3  . clopidogrel (PLAVIX) 75 MG tablet Take 1 tablet (75 mg total) by mouth daily. 30 tablet 3  . glucose blood (ONETOUCH VERIO) test strip Check blood sugar 2-3 times a week.  Dx:E11.65 100 each 12  . metoprolol succinate (TOPROL-XL) 100 MG 24 hr tablet Take 1 tablet (100 mg total) by mouth daily. Take with or immediately following a meal. 90 tablet 3  . Multiple Vitamins-Minerals (MULTIVITAMIN WITH MINERALS) tablet Take 1 tablet by mouth daily.    Alexis Bray. ONETOUCH DELICA LANCETS FINE MISC Check blood sugar 2-3 times a  week.  Dx:E11.65 100 each 12  . pioglitazone (ACTOS) 30 MG tablet Take 1 tablet (30 mg total) by mouth daily. 30 tablet 2  . sertraline (ZOLOFT) 50 MG tablet Take 1 tablet (50 mg total) by mouth daily. 90 tablet 1   No  facility-administered medications prior to visit.     PAST MEDICAL HISTORY: Past Medical History:  Diagnosis Date  . Diabetes ([facility])   . Diabetes mellitus without complication ([facility])   . History of chicken pox   . Hypertension   . Prediabetes   . TIA (transient ischemic attack)    09/2016, 10/2016    PAST SURGICAL HISTORY: Past Surgical History:  Procedure Laterality Date  . Ankles surgery Right 2005  . CESAREAN SECTION    . EYE SURGERY      FAMILY HISTORY: Family History  Problem Relation Age of Onset  . Adopted: Yes    SOCIAL HISTORY: Social History   Social History  . Marital status: Single    Spouse name: N/A  . Number of children: 2  . Years of education: 12   Occupational History  .      [employer]   Social History Main Topics  . Smoking status: Never Smoker  . Smokeless tobacco: Never Used  . Alcohol use Yes     Comment: 1-2 per week, 01/04/17 quit  . Drug use: No  . Sexual activity: Not on file   Other Topics Concern  . Not on file   Social History Narrative   Lives alone   Caffeine- one a day     PHYSICAL EXAM  Vitals:   01/04/17 1503 01/04/17 1509  BP: (!)140/80    Pulse: 67 66  Weight: 138 lb (62.6 kg)   Height: 5\' 3"  (1.6 m)    Body mass index is 24.45 kg/m.  Generalized: Well developed, in no acute distress  Head: normocephalic and atraumatic,. Oropharynx benign  Neck: Supple, no carotid bruits  Cardiac: Regular rate rhythm, no murmur  Musculoskeletal: No deformity   Neurological examination   Mentation: Alert oriented to time, place, history taking. Attention span and concentration appropriate. Recent and remote memory intact.  Follows all commands speech and language fluent.   Cranial nerve II-XII: Fundoscopic exam reveals sharp disc margins.Pupils were equal round reactive to light Right homonymous hemianopsia extraocular movements were full, Facial sensation and strength were normal. hearing was intact to finger  rubbing bilaterally. Uvula tongue midline. head turning and shoulder shrug were normal and symmetric.Tongue protrusion into cheek strength was normal. Motor: normal bulk and tone, full strength in the BUE, BLE, fine finger movements normal, no pronator drift. No focal weakness Sensory: normal and symmetric to light touch, pinprick, and  Vibration, in the upper and lower extremities  Coordination: finger-nose-finger, heel-to-shin bilaterally, no dysmetria Reflexes: 1+ upper lower and symmetric, plantar responses were flexor bilaterally. Gait and Station: Rising up from seated position without assistance, normal stance,  moderate stride, good arm swing, smooth turning, able to perform tiptoe, and heel walking without difficulty. Tandem gait is steady  DIAGNOSTIC DATA (LABS, IMAGING, TESTING) - I reviewed patient records, labs, notes, testing and imaging myself where available.  Lab Results  Component Value Date   WBC 6.0 10/23/2016   HGB 11.1 (L) 10/23/2016   HCT 35.5 (L) 10/23/2016   MCV 72.4 (L) 10/23/2016   PLT 305 10/23/2016      Component Value Date/Time   NA 136 10/23/2016 0356   K 3.8 10/23/2016 0356  CL 103 10/23/2016 0356   CO2 23 10/23/2016 0356   GLUCOSE 189 (H) 10/23/2016 0356   BUN 15 10/23/2016 0356   CREATININE 0.56 10/23/2016 0356   CALCIUM 9.4 10/23/2016 0356   PROT 7.5 10/22/2016 0716   ALBUMIN 3.8 10/22/2016 0716   AST 20 10/22/2016 0716   ALT 15 10/22/2016 0716   ALKPHOS 65 10/22/2016 0716   BILITOT 0.3 10/22/2016 0716   GFRNONAA >60 10/23/2016 0356   GFRAA >60 10/23/2016 0356   Lab Results  Component Value Date   CHOL 165 09/30/2016   HDL 50.00 09/30/2016   LDLCALC 101 (H) 09/30/2016   TRIG 74.0 09/30/2016   CHOLHDL 3 09/30/2016   Lab Results  Component Value Date   HGBA1C 11.5 (H) 10/22/2016   Lab Results  Component Value Date   VITAMINB12 899 10/22/2016   Lab Results  Component Value Date   TSH 3.978 10/22/2016      ASSESSMENT AND  PLAN  61 y.o. year old female  has a past medical history of Diabetes (HCC); Diabetes mellitus without complication ([facility]);; Hypertension; ; and TIA (transient ischemic attack). here for hospital follow-up for stroke.MRI repeat did show progression from previous MRI but pt has no neuro changes since discharge. MRA. Head left PCA proximal occlusion . Carotid Doppler unremarkable. 2-D echo EF 60-65%. LDL 101. Hemoglobin A1c 11.9 EEG mildly abnormal due to abnormal intermittent slowing in the left temporal region no seizure activity The patient is a current patient of Dr. Roda ShuttersXu  who is out of the office today . This note is sent to the work in doctor.     Stressed the importance of management of risk factors to prevent further stroke Continue Plavix and aspirin for secondary stroke prevention for 3 months then Plavix alone on or around 01/23/17. .  Maintain strict control of hypertension with blood pressure goal below 130/90, today's reading 140/80 continue antihypertensive medications Control of diabetes with hemoglobin A1c below 6.5 followed by primary care most recent hemoglobin A1c 11.9 continue diabetic medications Cholesterol with LDL cholesterol less than 70, followed by primary care,  most recent101 continue statin drugs Lipitor Exercise by walking, slowly increase ,  eat healthy diet with whole grains,  fresh fruits and vegetables Follow up with ophthalmology for right field cut, will need  close monitoring of her visual fields and clearance to drive if approved F/U here in 6 months Discussed risk for recurrent stroke/ TIA and answered additional questions This was a prolonged visit requiring 30 minutes and medical decision making of high complexity with extensive review of history, hospital chart, counseling and answering questions Nilda RiggsNancy Carolyn Licia Harl, [facility]NP, [facility] MayvilleBC, APRN  [facility] [address], [address] [place], [place] [address] [phone]

## 2017-01-04 ENCOUNTER — Telehealth: Payer: Self-pay | Admitting: Nurse Practitioner

## 2017-01-04 ENCOUNTER — Ambulatory Visit (INDEPENDENT_AMBULATORY_CARE_PROVIDER_SITE_OTHER): Payer: 59 | Admitting: Nurse Practitioner

## 2017-01-04 ENCOUNTER — Encounter: Payer: Self-pay | Admitting: Nurse Practitioner

## 2017-01-04 VITALS — BP 140/80 | HR 66 | Ht 63.0 in | Wt 138.0 lb

## 2017-01-04 DIAGNOSIS — E785 Hyperlipidemia, unspecified: Secondary | ICD-10-CM | POA: Diagnosis not present

## 2017-01-04 DIAGNOSIS — H5340 Unspecified visual field defects: Secondary | ICD-10-CM | POA: Diagnosis not present

## 2017-01-04 DIAGNOSIS — I63532 Cerebral infarction due to unspecified occlusion or stenosis of left posterior cerebral artery: Secondary | ICD-10-CM | POA: Diagnosis not present

## 2017-01-04 DIAGNOSIS — I10 Essential (primary) hypertension: Secondary | ICD-10-CM

## 2017-01-04 NOTE — Patient Instructions (Signed)
Stressed the importance of management of risk factors to prevent further stroke Continue Plavix and aspirin for secondary stroke prevention Maintain strict control of hypertension with blood pressure goal below 130/90, today's reading 140/80 continue antihypertensive medications Control of diabetes with hemoglobin A1c below 6.5 followed by primary care most recent hemoglobin A1c 11.9 continue diabetic medications Cholesterol with LDL cholesterol less than 70, followed by primary care,  most recent101 continue statin drugs Lipitor Exercise by walking, slowly increase ,  eat healthy diet with whole grains,  fresh fruits and vegetables Follow up with ophthalmology for right field cut, will need  close monitoring of her visual fields and clearance to drive if approved F/U here in 6 months

## 2017-01-04 NOTE — Telephone Encounter (Signed)
Patient seen in the office today. I forgot to tell her that she continues her Plavix and aspirin for 3 months and then takes Plavix only. This should  occur on or around 01/23/2017. Please call the patient

## 2017-01-05 NOTE — Telephone Encounter (Signed)
LVM with detailed  instructions per Enid Skeens Martin, NP. Repeated instructions in full and left number for any questions.

## 2017-01-24 NOTE — Progress Notes (Signed)
I reviewed note and agree with plan.   Finas Delone R. Prospero Mahnke, MD  Certified in Neurology, Neurophysiology and Neuroimaging  [facility] [address], [address] [place], [place] [address] [phone]   

## 2017-03-23 ENCOUNTER — Ambulatory Visit: Payer: 59 | Admitting: Neurology

## 2017-05-19 DIAGNOSIS — Z0271 Encounter for disability determination: Secondary | ICD-10-CM

## 2017-07-06 NOTE — Progress Notes (Deleted)
[facility]  PATIENT: Alexis Bray DOB: 04/04/1956   REASON FOR VISIT: Hospital follow-up for stroke  HISTORY FROM: Patient and daughter at the    HISTORY OF PRESENT ILLNESS:FROM RECORDKaren Bray an 62 y.o.femalewith a history of TIA, diabetes mellitus and hypertension [facility] for new onset visual changes involving right visual field. Onset of symptoms was unclear based on medical record notes. CT scan of her head showed left PCA territory watershed subacute to chronic infarctions. No clear acute intercranial lesion was noted. Patient had a recent TIA and reportedly has had residual memory difficulty. She's been taking aspirin and Plavix daily. She has not experienced a change in speech and no facial droop has been reported. She's had no weakness and numbness involving extremities. His LKW is unclear, possible on 10/20/2016. Patient was not administered IV t-PA secondary to unclear time of onset of symptoms. She was admitted for further evaluation and treatment. SUBJECTIVE (INTERVAL HISTORY) No family is at bedside. 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 [facility] for right PCA scattered infarct. And this time was told to go get more tests by PCP, but after MRI repeat, she was sent to ER for admission. MRI repeat did show progression from previous MRI but pt has no neuro changes since discharge. MRA. Head left PCA proximal occlusion . Carotid Doppler unremarkable. 2-D echo EF 60-65%. LDL 101. Hemoglobin A1c 11.9 EEG mildly abnormal due to abnormal intermittent slowing in the left temporal region no seizure activity. She is still driving and I told her that she can not drive due to hemianopia.  Interval history 07/31/2018CM patient returns for hospital follow-up after stroke in April. She is currently on Plavix and aspirin for secondary stroke prevention for 3 months and then Plavix alone. She has not had further stroke or TIA symptoms. She has  minimal bruising and bleeding. In addition she is on Lipitor without myalgias. CBC this morning 100. Blood pressure today 140/80. She continues to have right hemianopsia and has been seeing ophthalmology. She is exercising by walking. She returns for reevaluation Cardiac event monitorSinus rhythm,No AV block or pauses,4 beat run of nonsustained ventricular tachycardia is noted on 11/24/16,No other arrhythmias REVIEW OF SYSTEMS: Full 14 system review of systems performed and notable only for those listed, all others are neg:  Constitutional: neg  Cardiovascular: neg Ear/Nose/Throat: neg  Skin: neg Eyes: Loss of vision Respiratory: neg Gastroitestinal: neg  Hematology/Lymphatic: neg  Endocrine: neg Musculoskeletal:neg Allergy/Immunology: neg Neurological: neg Psychiatric: Depression Sleep : neg   ALLERGIES: No Known Allergies  HOME MEDICATIONS: Outpatient Medications Prior to Visit  Medication Sig Dispense Refill  . amLODipine-valsartan (EXFORGE) 5-320 MG tablet Take 1 tablet by mouth daily. 30 tablet 2  . aspirin EC 81 MG tablet Take 81 mg by mouth daily.    Marland Kitchen. atorvastatin (LIPITOR) 80 MG tablet Take 1 tablet (80 mg total) by mouth daily. 30 tablet 3  . clopidogrel (PLAVIX) 75 MG tablet Take 1 tablet (75 mg total) by mouth daily. 30 tablet 3  . glucose blood (ONETOUCH VERIO) test strip Check blood sugar 2-3 times a week.  Dx:E11.65 100 each 12  . metoprolol succinate (TOPROL-XL) 100 MG 24 hr tablet Take 1 tablet (100 mg total) by mouth daily. Take with or immediately following a meal. 90 tablet 3  . Multiple Vitamins-Minerals (MULTIVITAMIN WITH MINERALS) tablet Take 1 tablet by mouth daily.    [name]. ONETOUCH DELICA LANCETS FINE MISC Check blood sugar 2-3 times a  week.  Dx:E11.65 100 each 12  . pioglitazone (ACTOS) 30 MG tablet Take 1 tablet (30 mg total) by mouth daily. 30 tablet 2  . sertraline (ZOLOFT) 50 MG tablet Take 1 tablet (50 mg total) by mouth daily. 90 tablet 1   No  facility-administered medications prior to visit.     PAST MEDICAL HISTORY: Past Medical History:  Diagnosis Date  . Diabetes (HCC)   . Diabetes mellitus without complication (HCC)   . History of chicken pox   . Hypertension   . Prediabetes   . TIA (transient ischemic attack)    09/2016, 10/2016    PAST SURGICAL HISTORY: Past Surgical History:  Procedure Laterality Date  . Ankles surgery Right 2005  . CESAREAN SECTION    . EYE SURGERY      FAMILY HISTORY: Family History  Adopted: Yes    SOCIAL HISTORY: Social History   Socioeconomic History  . Marital status: Single    Spouse name: Not on file  . Number of children: 2  . Years of education: 5112  . Highest education level: Not on file  Social Needs  . Financial resource strain: Not on file  . Food insecurity - worry: Not on file  . Food insecurity - inability: Not on file  . Transportation needs - medical: Not on file  . Transportation needs - non-medical: Not on file  Occupational History    Comment: [employer]  Tobacco Use  . Smoking status: Never Smoker  . Smokeless tobacco: Never Used  Substance and Sexual Activity  . Alcohol use: Yes    Comment: 1-2 per week, 01/04/17 quit  . Drug use: No  . Sexual activity: Not on file  Other Topics Concern  . Not on file  Social History Narrative   Lives alone   Caffeine- one a day     PHYSICAL EXAM  Vitals:   01/04/17 1503 01/04/17 1509  BP: (!)140/80    Pulse: 67 66  Weight: 138 lb (62.6 kg)   Height: 5\' 3"  (1.6 m)    There is no height or weight on file to calculate BMI.  Generalized: Well developed, in no acute distress  Head: normocephalic and atraumatic,. Oropharynx benign  Neck: Supple, no carotid bruits  Cardiac: Regular rate rhythm, no murmur  Musculoskeletal: No deformity   Neurological examination   Mentation: Alert oriented to time, place, history taking. Attention span and concentration appropriate. Recent and remote memory  intact.  Follows all commands speech and language fluent.   Cranial nerve II-XII: Fundoscopic exam reveals sharp disc margins.Pupils were equal round reactive to light Right homonymous hemianopsia extraocular movements were full, Facial sensation and strength were normal. hearing was intact to finger rubbing bilaterally. Uvula tongue midline. head turning and shoulder shrug were normal and symmetric.Tongue protrusion into cheek strength was normal. Motor: normal bulk and tone, full strength in the BUE, BLE, fine finger movements normal, no pronator drift. No focal weakness Sensory: normal and symmetric to light touch, pinprick, and  Vibration, in the upper and lower extremities  Coordination: finger-nose-finger, heel-to-shin bilaterally, no dysmetria Reflexes: 1+ upper lower and symmetric, plantar responses were flexor bilaterally. Gait and Station: Rising up from seated position without assistance, normal stance,  moderate stride, good arm swing, smooth turning, able to perform tiptoe, and heel walking without difficulty. Tandem gait is steady  DIAGNOSTIC DATA (LABS, IMAGING, TESTING) - I reviewed patient records, labs, notes, testing and imaging myself where available.  Lab Results  Component Value  Date   WBC 6.0 10/23/2016   HGB 11.1 (L) 10/23/2016   HCT 35.5 (L) 10/23/2016   MCV 72.4 (L) 10/23/2016   PLT 305 10/23/2016      Component Value Date/Time   NA 136 10/23/2016 0356   K 3.8 10/23/2016 0356   CL 103 10/23/2016 0356   CO2 23 10/23/2016 0356   GLUCOSE 189 (H) 10/23/2016 0356   BUN 15 10/23/2016 0356   CREATININE 0.56 10/23/2016 0356   CALCIUM 9.4 10/23/2016 0356   PROT 7.5 10/22/2016 0716   ALBUMIN 3.8 10/22/2016 0716   AST 20 10/22/2016 0716   ALT 15 10/22/2016 0716   ALKPHOS 65 10/22/2016 0716   BILITOT 0.3 10/22/2016 0716   GFRNONAA >60 10/23/2016 0356   GFRAA >60 10/23/2016 0356   Lab Results  Component Value Date   CHOL 165 09/30/2016   HDL 50.00 09/30/2016    LDLCALC 101 (H) 09/30/2016   TRIG 74.0 09/30/2016   CHOLHDL 3 09/30/2016   Lab Results  Component Value Date   HGBA1C 11.5 (H) 10/22/2016   Lab Results  Component Value Date   VITAMINB12 899 10/22/2016   Lab Results  Component Value Date   TSH 3.978 10/22/2016      ASSESSMENT AND PLAN  62 y.o. year old female  has a past medical history of Diabetes (HCC); Diabetes mellitus without complication (HCC);; Hypertension; ; and TIA (transient ischemic attack). here for hospital follow-up for stroke.MRI repeat did show progression from previous MRI but pt has no neuro changes since discharge. MRA. Head left PCA proximal occlusion . Carotid Doppler unremarkable. 2-D echo EF 60-65%. LDL 101. Hemoglobin A1c 11.9 EEG mildly abnormal due to abnormal intermittent slowing in the left temporal region no seizure activity The patient is a current patient of Dr. Roda Shutters  who is out of the office today . This note is sent to the work in doctor.     Stressed the importance of management of risk factors to prevent further stroke Continue Plavix and aspirin for secondary stroke prevention for 3 months then Plavix alone on or around 01/23/17. .  Maintain strict control of hypertension with blood pressure goal below 130/90, today's reading 140/80 continue antihypertensive medications Control of diabetes with hemoglobin A1c below 6.5 followed by primary care most recent hemoglobin A1c 11.9 continue diabetic medications Cholesterol with LDL cholesterol less than 70, followed by primary care,  most recent101 continue statin drugs Lipitor Exercise by walking, slowly increase ,  eat healthy diet with whole grains,  fresh fruits and vegetables Follow up with ophthalmology for right field cut, will need  close monitoring of her visual fields and clearance to drive if approved F/U here in 6 months Discussed risk for recurrent stroke/ TIA and answered additional questions This was a prolonged visit requiring 30 minutes  and medical decision making of high complexity with extensive review of history, hospital chart, counseling and answering questions Nilda Riggs, [facility], [facility], APRN  [facility] [address], [address] [place], [place] [address] [phone]

## 2017-07-07 ENCOUNTER — Ambulatory Visit: Payer: 59 | Admitting: Nurse Practitioner

## 2017-07-08 ENCOUNTER — Encounter: Payer: Self-pay | Admitting: Nurse Practitioner

## 2017-09-21 DIAGNOSIS — Z0271 Encounter for disability determination: Secondary | ICD-10-CM

## 2018-04-18 IMAGING — MR MR HEAD W/O CM
9 of 10 series · 43 of 48 positions shown · non-contrast
Comparison: Head CT 09/28/2016

CLINICAL DATA: Memory loss and confusion over the last month.

EXAM:
MRI HEAD WITHOUT CONTRAST
TECHNIQUE: Multiplanar, multiecho pulse sequences of the brain and surrounding
structures were obtained without intravenous contrast.

[Series 2: T1 · sagittal · 5.0mm · 0.45mm/px · 3 of 23 slices shown]
[im 1/23]
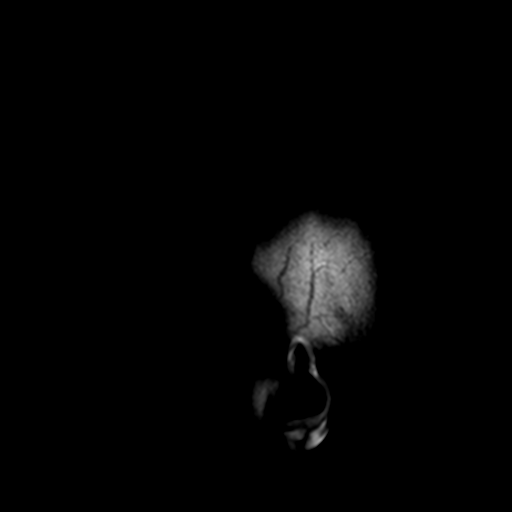
[im 12/23]
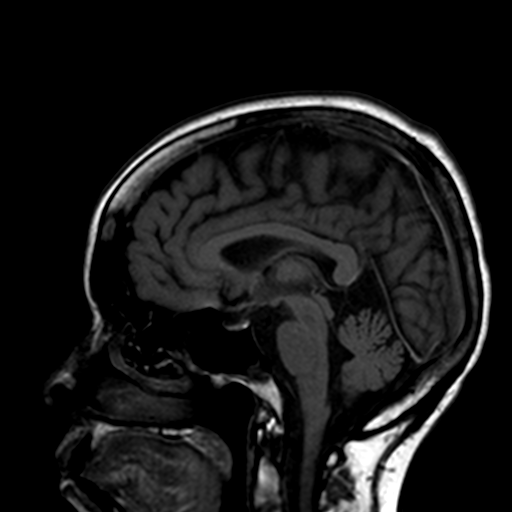
[im 23/23]
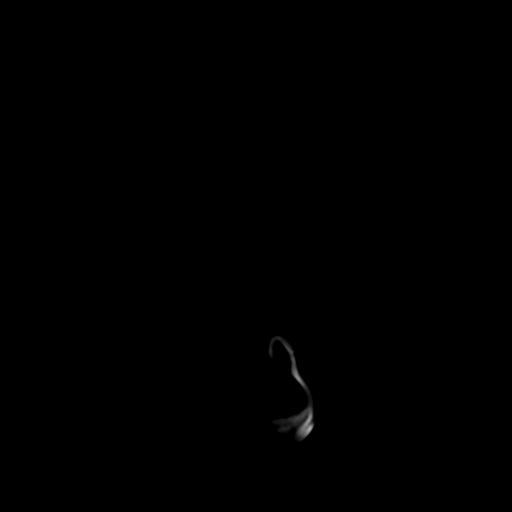

[Series 3: DWI · axial · 3.0mm · 2.19mm/px · z∈[-59,+93]mm · 10 of 94 slices shown (1 of 4)]
[im 1/94]
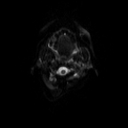
[im 11/94]
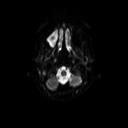
[im 21/94]
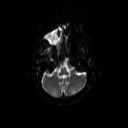
[im 32/94]
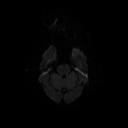
[im 42/94]
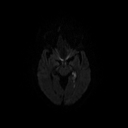
[im 52/94]
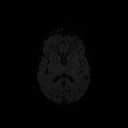
[im 63/94]
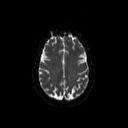
[im 73/94]
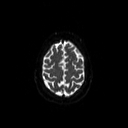
[im 83/94]
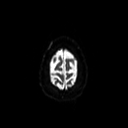
[im 94/94]
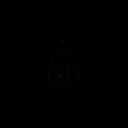

[Series 4: DWI · axial · 3.0mm · 2.19mm/px · z∈[-59,+93]mm · 5 of 47 slices shown (2 of 4)]
[im 1/47]
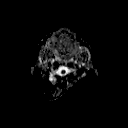
[im 12/47]
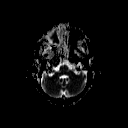
[im 24/47]
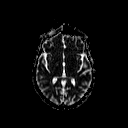
[im 35/47]
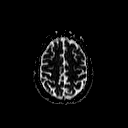
[im 47/47]
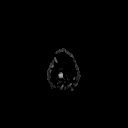

[Series 5: DWI · coronal · 3.0mm · 1.46mm/px · 10 of 94 slices shown (3 of 4)]
[im 1/94]
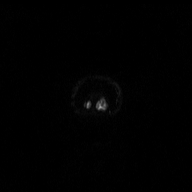
[im 11/94]
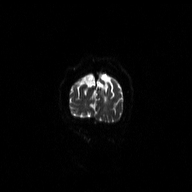
[im 21/94]
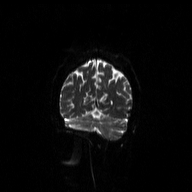
[im 32/94]
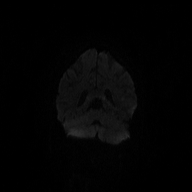
[im 42/94]
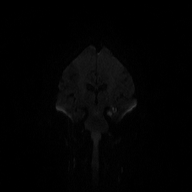
[im 52/94]
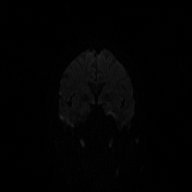
[im 63/94]
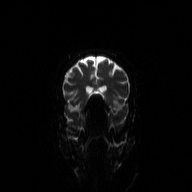
[im 73/94]
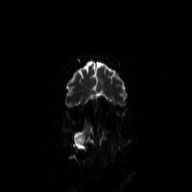
[im 83/94]
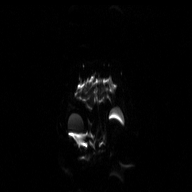
[im 94/94]
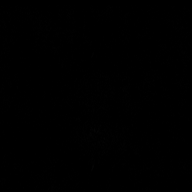

[Series 6: DWI · coronal · 3.0mm · 1.46mm/px · 5 of 47 slices shown (4 of 4)]
[im 1/47]
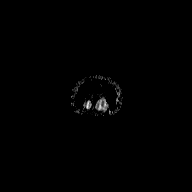
[im 12/47]
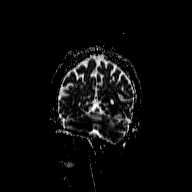
[im 24/47]
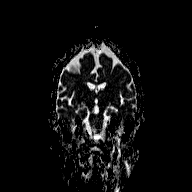
[im 35/47]
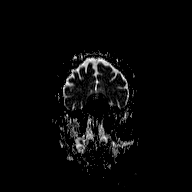
[im 47/47]
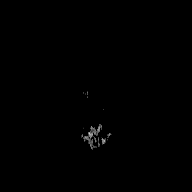

[Series 7: FLAIR · axial · 3.0mm · 0.45mm/px · z∈[-61,+95]mm · 3 of 27 slices shown]
[im 1/27]
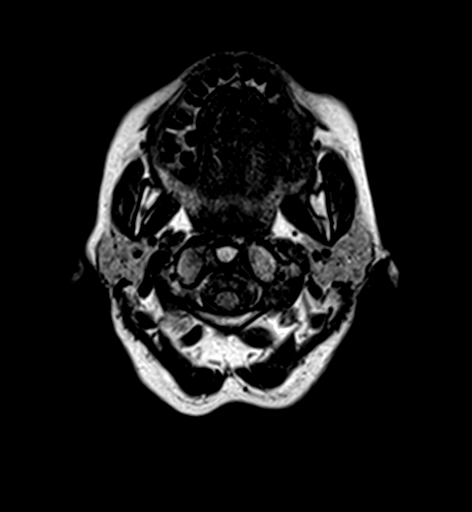
[im 14/27]
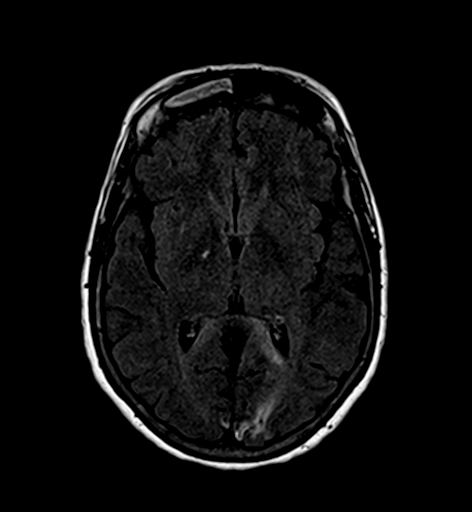
[im 27/27]
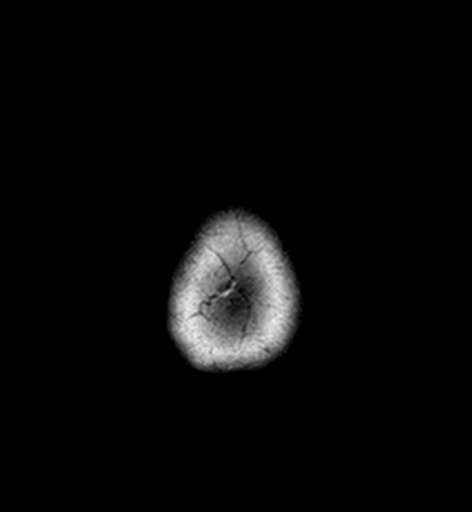

[Series 8: T2 · axial · 5.0mm · 0.45mm/px · z∈[-59,+94]mm · 2 of 23 slices shown (1 of 2)]
[im 1/23]
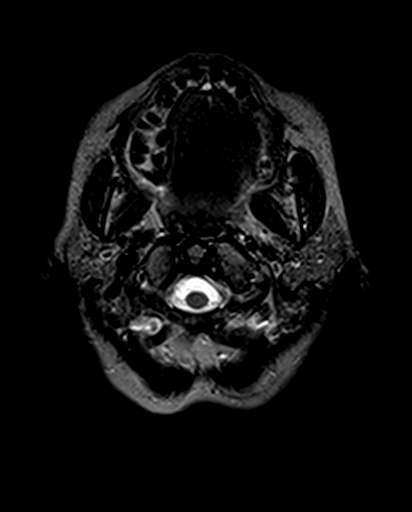
[im 23/23]
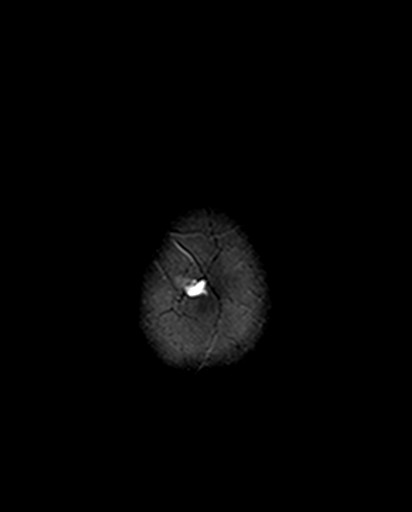

[Series 9: T2 · axial · 5.0mm · 0.45mm/px · z∈[-60,+94]mm · 2 of 23 slices shown (2 of 2)]
[im 1/23]
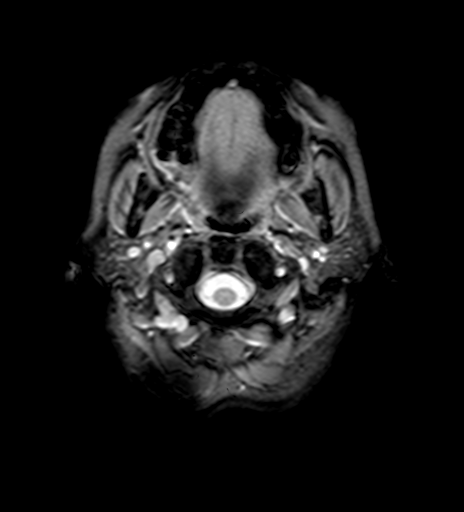
[im 23/23]
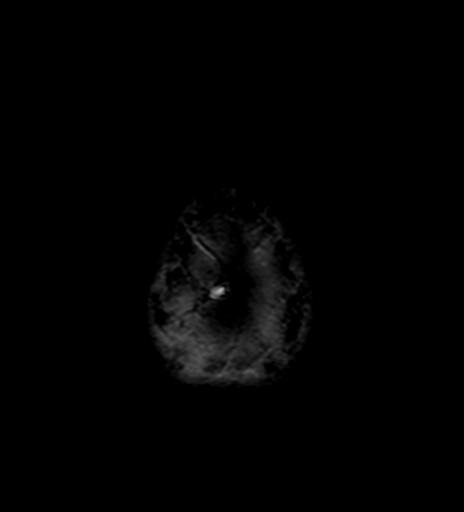

[Series 11: T2 post-contrast · coronal · 5.0mm · 0.45mm/px · 3 of 28 slices shown]
[im 1/28]
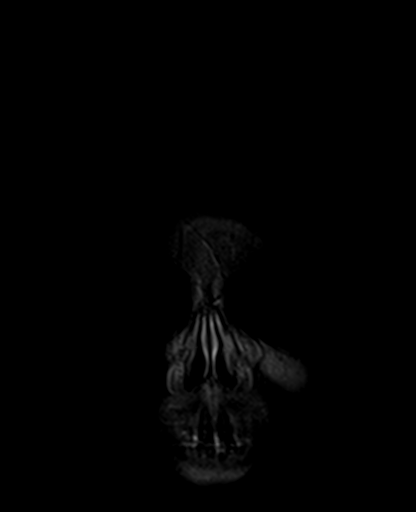
[im 14/28]
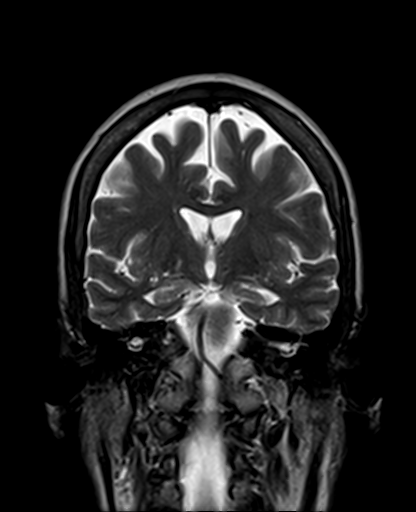
[im 28/28]
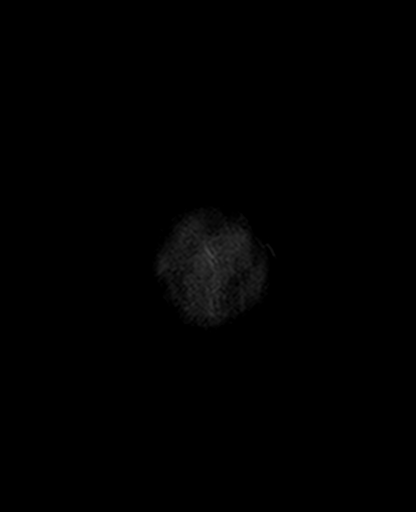

[43 of 48 positions shown; findings below may reference images not displayed]

FINDINGS: Brain: The brainstem and cerebellum are normal. There is late
subacute infarction in the left occipital lobe. There is subacute
infarction in other areas at the left PCA territory including the
posteromedial temporal lobe and temporal occipital junction region.
There is involvement of the far left lateral a age of the corpus
callosum. No evidence of swelling or hemorrhage. Elsewhere, there
are a few old punctate small vessel infarctions. No mass lesion,
hydrocephalus or extra-axial collection.

Vascular: Major vessels at the base of the brain show flow.

Skull and upper cervical spine: Negative

Sinuses/Orbits: Complete opacification of the right maxillary sinus
consistent with acute sinusitis. Opacification also of the right
frontal and ethmoid regions. Right mastoid effusion.

Other: None significant
IMPRESSION: Mixed age infarctions in the left PCA territory. Infarction in the
left occipital lobe looks late subacute. Infarction in the
posteromedial temporal lobe and temporal occipital junction region
looks subacute.

Inflammatory sinus disease on the right.  Right mastoid effusion.

I am in the process of calling this report.

## 2018-06-08 NOTE — Therapy (Signed)
.  [facility] [address] [place], [place], [address] Phone: [phone]   Fax:  [phone]  Patient Details  Name: Alexis Bray MRN: [id number] Date of Birth: Feb 13, 1956 Provider: Ellouise Newer  Encounter Date: 06/08/2018  SPEECH THERAPY DISCHARGE SUMMARY  Visits from Start of Care: one (Eval)  Current functional level related to goals / functional outcomes: Pt arrived for evaluation and did not schedule any follow up sessions.   Remaining deficits: Goals at time of eval were as follows:  SLP Short Term Goals - 12/07/16 1659              SLP SHORT TERM GOAL #1    Title Pt will tell SLP 4 memory strategies/compensations with modified independence     Time 4    Period --  visits    Status New         SLP SHORT TERM GOAL #2    Title pt will use a memory strategy in at least one therapy session    Time 4    Period --  visits    Status New         SLP SHORT TERM GOAL #3    Title pt/daughter will report pt using at least one strategy outside of therapy session    Time 4    Period --  visits    Status New         SLP SHORT TERM GOAL #4    Title pt will demo alternating attention between two simple cognitive linguistic tasks achieving 95% on both tasks after PRN self-correction    Time 4    Period --  visits    Status New                       SLP Long Term Goals - 12/07/16 1702              SLP LONG TERM GOAL #1    Title pt will demo divided attention with simple cognitive communication tasks    Time 8    Period --  visits    Status New         SLP LONG TERM GOAL #2    Title pt will use two memory strategies between appointments as reported by pt/daughter    Time 8    Period --  visits    Status New         SLP LONG TERM GOAL #3    Title pt will demo knowledge of how to use memory compensation system    Time 8    Period --  visits    Status New         Education /  Equipment: Evaluation results.   Plan: Patient agrees to discharge.  Patient goals were not met. Patient is being discharged due to not returning since the last visit.  ?????      [place] ,[place], CCC-SLP  06/08/2018, 8:50 AM  [facility] [address] [place], [place], [address] Phone: [phone]   Fax:  [phone]
